# Patient Record
Sex: Female | Born: 1990 | Hispanic: No | Marital: Single | Smoking: Current every day smoker
Health system: Southern US, Community
[De-identification: ages and names within clinical notes are randomized; demographics above are authoritative.]

## PROBLEM LIST (undated history)

## (undated) DIAGNOSIS — F410 Panic disorder [episodic paroxysmal anxiety] without agoraphobia: Secondary | ICD-10-CM

## (undated) DIAGNOSIS — N83209 Unspecified ovarian cyst, unspecified side: Secondary | ICD-10-CM

## (undated) DIAGNOSIS — K2970 Gastritis, unspecified, without bleeding: Secondary | ICD-10-CM

## (undated) DIAGNOSIS — F419 Anxiety disorder, unspecified: Secondary | ICD-10-CM

## (undated) DIAGNOSIS — B009 Herpesviral infection, unspecified: Secondary | ICD-10-CM

## (undated) DIAGNOSIS — F329 Major depressive disorder, single episode, unspecified: Secondary | ICD-10-CM

## (undated) DIAGNOSIS — K219 Gastro-esophageal reflux disease without esophagitis: Secondary | ICD-10-CM

## (undated) DIAGNOSIS — F32A Depression, unspecified: Secondary | ICD-10-CM

## (undated) HISTORY — PX: NO PAST SURGERIES: SHX2092

---
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
## 2011-06-12 ENCOUNTER — Emergency Department (HOSPITAL_BASED_OUTPATIENT_CLINIC_OR_DEPARTMENT_OTHER)
Admission: EM | Admit: 2011-06-12 | Discharge: 2011-06-13 | Disposition: A | Discharge status: Home / Self Care | Payer: Commercial Managed Care - PPO | Attending: Emergency Medicine | Admitting: Emergency Medicine

## 2011-06-12 ENCOUNTER — Encounter: Payer: Self-pay | Admitting: *Deleted

## 2011-06-12 DIAGNOSIS — M549 Dorsalgia, unspecified: Secondary | ICD-10-CM

## 2011-06-12 DIAGNOSIS — M545 Low back pain, unspecified: Secondary | ICD-10-CM | POA: Insufficient documentation

## 2011-06-12 NOTE — ED Notes (Signed)
Pt removed from backboard by Dr Manus Gunning.

## 2011-06-12 NOTE — ED Notes (Signed)
C/o low back pain after sitting up on a bed and filling something pop. Pt was able to move from stool to stretcher when EMS arrived to get pt.

## 2011-06-13 MED ORDER — CYCLOBENZAPRINE HCL 10 MG PO TABS: 10.0000 mg | ORAL_TABLET | Freq: Two times a day (BID) | ORAL | Status: AC | PRN

## 2011-06-13 MED ORDER — IBUPROFEN 800 MG PO TABS: 800.0000 mg | ORAL_TABLET | Freq: Three times a day (TID) | ORAL | Status: AC

## 2011-06-13 MED ORDER — HYDROMORPHONE HCL 1 MG/ML IJ SOLN
1.0000 mg | Freq: Once | INTRAMUSCULAR | Status: AC
  Administered 2011-06-13: 1 mg via INTRAMUSCULAR
  Filled 2011-06-13: qty 1

## 2011-06-13 MED ORDER — HYDROCODONE-ACETAMINOPHEN 7.5-500 MG/15ML PO SOLN: 15.0000 mL | Freq: Four times a day (QID) | ORAL | Status: AC | PRN

## 2011-06-13 NOTE — ED Provider Notes (Signed)
History     CSN: [id number] Arrival date & time: 06/12/2011 11:30 PM  Chief Complaint  Patient presents with  . Back Pain   Patient is a 20 y.o. female presenting with back pain. The history is provided by the patient.  Back Pain  This is a new problem. The current episode started 3 to 5 hours ago. The problem occurs constantly. The problem has not changed since onset.Associated with: in iher CNA class demonsrating moving techniques, she was laying down, sat up and had sudden  onset L LBP. The pain is present in the lumbar spine. The quality of the pain is described as shooting. The pain radiates to the left thigh. The pain is moderate. The symptoms are aggravated by twisting and bending. Pertinent negatives include no chest pain, no fever, no numbness, no weight loss, no headaches, no abdominal swelling, no bowel incontinence, no perianal numbness, no bladder incontinence, no dysuria, no pelvic pain, no leg pain, no paresthesias, no tingling and no weakness. She has tried nothing for the symptoms. Risk factors: ha shurt her back in the past but no h/o DM or cancer.    History reviewed. No pertinent past medical history.  History reviewed. No pertinent past surgical history.  History reviewed. No pertinent family history.  History  Substance Use Topics  . Smoking status: Not on file  . Smokeless tobacco: Not on file  . Alcohol Use: Not on file    OB History    Grav Para Term Preterm Abortions TAB SAB Ect Mult Living                  Review of Systems  Constitutional: Negative for fever and weight loss.  Respiratory: Negative for shortness of breath.   Cardiovascular: Negative for chest pain.  Gastrointestinal: Negative for bowel incontinence.  Genitourinary: Negative for bladder incontinence, dysuria and pelvic pain.  Musculoskeletal: Positive for back pain. Negative for gait problem.  Neurological: Negative for tingling, weakness, numbness, headaches and paresthesias.     Physical Exam  BP 90/58  Pulse 77  Temp(Src) 98.7 F (37.1 C) (Oral)  Resp 20  SpO2 99%  Physical Exam  Constitutional: She is oriented to person, place, and time. She appears well-developed and well-nourished.  HENT:  Head: Normocephalic and atraumatic.  Eyes: Conjunctivae and EOM are normal. Pupils are equal, round, and reactive to light.  Neck: Full passive range of motion without pain. Neck supple. No thyromegaly present.       No meningismus  Cardiovascular: Normal rate, regular rhythm, S1 normal, S2 normal and intact distal pulses.   Pulmonary/Chest: Effort normal and breath sounds normal.  Abdominal: Soft. Bowel sounds are normal. There is no tenderness. There is no CVA tenderness.  Musculoskeletal: Normal range of motion.       Left paralumbar TTP lower lumbar area, no midline tenderness or deformity. No LE deficits with strengths and sensorium and DTRs at knees/ ankle equal and symeteric.  Neurological: She is alert and oriented to person, place, and time. She has normal strength and normal reflexes. No cranial nerve deficit or sensory deficit. She displays a negative Romberg sign. GCS eye subscore is 4. GCS verbal subscore is 5. GCS motor subscore is 6.       Normal Gait  Skin: Skin is warm and dry. No rash noted. No cyanosis. Nails show no clubbing.  Psychiatric: She has a normal mood and affect. Her speech is normal and behavior is normal.    ED Course  Procedures  MDM  LBP with mechanism that suggest strain, no deficits on exam or red flags to suggest need for emergent MRI.  Pain control and PCP referral with strict return precautions verbalized as understood.        Sunnie Nielsen, MD 06/13/11 [phone]

## 2011-06-17 ENCOUNTER — Emergency Department (HOSPITAL_BASED_OUTPATIENT_CLINIC_OR_DEPARTMENT_OTHER)
Admission: EM | Admit: 2011-06-17 | Discharge: 2011-06-17 | Disposition: A | Discharge status: Home / Self Care | Payer: Commercial Managed Care - PPO | Attending: Emergency Medicine | Admitting: Emergency Medicine

## 2011-06-17 ENCOUNTER — Encounter (HOSPITAL_BASED_OUTPATIENT_CLINIC_OR_DEPARTMENT_OTHER): Payer: Self-pay

## 2011-06-17 DIAGNOSIS — K219 Gastro-esophageal reflux disease without esophagitis: Secondary | ICD-10-CM | POA: Insufficient documentation

## 2011-06-17 DIAGNOSIS — M549 Dorsalgia, unspecified: Secondary | ICD-10-CM | POA: Insufficient documentation

## 2011-06-17 HISTORY — DX: Gastro-esophageal reflux disease without esophagitis: K21.9

## 2011-06-17 HISTORY — DX: Gastritis, unspecified, without bleeding: K29.70

## 2011-06-17 NOTE — ED Notes (Signed)
Back injury last week-needs return to school clinic

## 2011-06-17 NOTE — ED Provider Notes (Signed)
History     CSN: [id number] Arrival date & time: 06/17/2011  4:12 PM  Chief Complaint  Patient presents with  . Back Pain   Patient is a 20 y.o. female presenting with back pain. The history is provided by the patient.  Back Pain  This is a new problem. The current episode started more than 2 days ago. The problem occurs constantly. The problem has been resolved. The pain is associated with no known injury. The pain is present in the lumbar spine. The quality of the pain is described as stabbing. The pain does not radiate. The pain is at a severity of 7/10. The pain is moderate. The symptoms are aggravated by twisting and bending. The pain is worse during the day. Pertinent negatives include no numbness. She has tried nothing for the symptoms. The treatment provided moderate relief.  Pt needs to be rechecked to return to work.  Pt reports she feels better.  Past Medical History  Diagnosis Date  . GERD (gastroesophageal reflux disease)   . Gastritis     History reviewed. No pertinent past surgical history.  No family history on file.  History  Substance Use Topics  . Smoking status: Never Smoker   . Smokeless tobacco: Not on file  . Alcohol Use: No    OB History    Grav Para Term Preterm Abortions TAB SAB Ect Mult Living                  Review of Systems  Musculoskeletal: Positive for back pain.  Neurological: Negative for numbness.  All other systems reviewed and are negative.    Physical Exam  BP 127/74  Pulse 91  Temp(Src) 98.7 F (37.1 C) (Oral)  Resp 16  Ht 5\' 4"  (1.626 m)  Wt 184 lb (83.462 kg)  BMI 31.58 kg/m2  SpO2 100%  Physical Exam  Nursing note and vitals reviewed. Constitutional: She is oriented to person, place, and time. She appears well-developed and well-nourished.  HENT:  Head: Normocephalic and atraumatic.  Eyes: Pupils are equal, round, and reactive to light.  Neck: Normal range of motion. Neck supple.  Cardiovascular: Normal rate.     Pulmonary/Chest: Effort normal.  Abdominal: Soft.  Musculoskeletal: Normal range of motion.  Neurological: She is alert and oriented to person, place, and time. She has normal reflexes.  Skin: Skin is warm and dry.  Psychiatric: She has a normal mood and affect.    ED Course  Procedures  MDM  Pt advised to return if any problems.      Langston Masker, [place] 06/17/11 1718

## 2011-06-19 NOTE — ED Provider Notes (Signed)
History/physical exam/procedure(s) were performed by non-physician practitioner and as supervising physician I was immediately available for consultation/collaboration. I have reviewed all notes and am in agreement with care and plan.   Hilario Quarry, MD 06/19/11 [phone]

## 2011-09-12 ENCOUNTER — Emergency Department (HOSPITAL_BASED_OUTPATIENT_CLINIC_OR_DEPARTMENT_OTHER)
Admission: EM | Admit: 2011-09-12 | Discharge: 2011-09-12 | Disposition: A | Discharge status: Home / Self Care | Payer: Commercial Managed Care - PPO | Attending: Emergency Medicine | Admitting: Emergency Medicine

## 2011-09-12 ENCOUNTER — Encounter (HOSPITAL_BASED_OUTPATIENT_CLINIC_OR_DEPARTMENT_OTHER): Payer: Self-pay | Admitting: *Deleted

## 2011-09-12 DIAGNOSIS — J329 Chronic sinusitis, unspecified: Secondary | ICD-10-CM | POA: Insufficient documentation

## 2011-09-12 DIAGNOSIS — K219 Gastro-esophageal reflux disease without esophagitis: Secondary | ICD-10-CM | POA: Insufficient documentation

## 2011-09-12 DIAGNOSIS — Z79899 Other long term (current) drug therapy: Secondary | ICD-10-CM | POA: Insufficient documentation

## 2011-09-12 HISTORY — DX: Major depressive disorder, single episode, unspecified: F32.9

## 2011-09-12 HISTORY — DX: Depression, unspecified: F32.A

## 2011-09-12 MED ORDER — AMOXICILLIN 500 MG PO CAPS: 500.0000 mg | ORAL_CAPSULE | Freq: Three times a day (TID) | ORAL | Status: AC

## 2011-09-12 MED ORDER — PSEUDOEPHEDRINE HCL 60 MG PO TABS: 60.0000 mg | ORAL_TABLET | ORAL | Status: AC | PRN

## 2011-09-12 MED ORDER — IBUPROFEN 800 MG PO TABS: 800.0000 mg | ORAL_TABLET | Freq: Three times a day (TID) | ORAL | Status: AC

## 2011-09-12 NOTE — ED Provider Notes (Signed)
History     CSN: [id number] Arrival date & time: 09/12/2011  3:42 PM   First MD Initiated Contact with Patient 09/12/11 1544      Chief Complaint  Patient presents with  . Eye Pain    (Consider location/radiation/quality/duration/timing/severity/associated sxs/prior treatment) HPI Patient presents with complaint of headache localized primarily behind her left eye. She describes approximately 2 weeks of nasal congestion and then pressure and pain behind her left eye beginning yesterday. This morning the headache was frontal and bilateral but then again later today if localized behind left eye. She has had some yellow drainage from her nose. She has not had fever. She has not had any changes in her vision or double vision. She's had no vomiting or ear pain. She tried ibuprofen once yesterday and today. Also took cold medicine yesterday which provided minimal relief. She has no other systemic symptoms no cough. She has not had any significant sick contacts. She does have a history of sinus infections. She states that her left upper teeth are hurting when she walks or Them.  Past Medical History  Diagnosis Date  . GERD (gastroesophageal reflux disease)   . Gastritis   . Depression     History reviewed. No pertinent past surgical history.  No family history on file.  History  Substance Use Topics  . Smoking status: Never Smoker   . Smokeless tobacco: Not on file  . Alcohol Use: No    OB History    Grav Para Term Preterm Abortions TAB SAB Ect Mult Living                  Review of Systems ROS reviewed and otherwise negative except for mentioned in HPI  Allergies  Review of patient's allergies indicates no known allergies.  Home Medications   Current Outpatient Rx  Name Route Sig Dispense Refill  . BUPROPION HCL 100 MG PO TABS Oral Take 100 mg by mouth daily.      Marland Kitchen DM-PHENYLEPHRINE-ACETAMINOPHEN 10-5-325 MG PO CAPS Oral Take 2 capsules by mouth once as needed. For sinus  pressure and headache     . IBUPROFEN 200 MG PO TABS Oral Take 400 mg by mouth every 6 (six) hours as needed. For pain     . AMOXICILLIN 500 MG PO CAPS Oral Take 1 capsule (500 mg total) by mouth 3 (three) times daily. 42 capsule 0  . IBUPROFEN 800 MG PO TABS Oral Take 1 tablet (800 mg total) by mouth 3 (three) times daily. 21 tablet 0  . MEDROXYPROGESTERONE ACETATE 150 MG/ML IM SUSP Intramuscular Inject 150 mg into the muscle every 3 (three) months.      Marland Kitchen PSEUDOEPHEDRINE HCL 60 MG PO TABS Oral Take 1 tablet (60 mg total) by mouth every 4 (four) hours as needed for congestion. 30 tablet 0    BP 116/94  Pulse 90  Temp(Src) 98.1 F (36.7 C) (Oral)  Resp 24  SpO2 100% Vitals reviewed Physical Exam Physical Examination: General appearance - alert, well appearing, and in no distress Mental status - alert, oriented to person, place, and time Eyes - pupils equal and reactive, extraocular eye movements intact Nose - normal and patent, no erythema, discharge or polyps, mild erythema of left nasal turbinates Face- ttp over left maxillary and left frontal sinuses Mouth - mucous membranes moist, pharynx normal without lesions Neck - supple, no significant adenopathy Chest - clear to auscultation, no wheezes, rales or rhonchi, symmetric air entry Heart - normal rate, regular  rhythm, normal S1, S2, no murmurs, rubs, clicks or gallops Abdomen - soft, nontender, nondistended, no masses or organomegaly Neurological - alert, oriented, normal speech, no focal findings or movement disorder noted Musculoskeletal - no joint tenderness, deformity or swelling Extremities - peripheral pulses normal, no pedal edema, no clubbing or cyanosis Skin - normal coloration and turgor, no rashes, no suspicious skin lesions noted  ED Course  Procedures (including critical care time)  Labs Reviewed - No data to display No results found.   1. Sinusitis       MDM  Patient presenting with pain behind left eye  with nasal congestion and drainage over the past 2 weeks with pain beginning over the last 2 days. She does have tenderness to palpation over her maxillary and frontal sinuses on the left as well as tooth pain. We'll start on antibiotics for bacterial sinusitis as well as recommended decongestant and ibuprofen 3 times daily for discomfort. Patient was discharged with strict return precautions and is agreeable with this plan.        Ethelda Chick, MD 09/12/11 [phone]

## 2011-09-12 NOTE — ED Notes (Signed)
Pain behind her left eye since waking up yesterday.  Denies injury.

## 2011-09-12 NOTE — ED Notes (Signed)
Visual acuity being done at present time.

## 2013-03-22 ENCOUNTER — Encounter: Payer: Self-pay | Admitting: *Deleted

## 2013-03-22 ENCOUNTER — Emergency Department (INDEPENDENT_AMBULATORY_CARE_PROVIDER_SITE_OTHER)
Admission: EM | Admit: 2013-03-22 | Discharge: 2013-03-22 | Disposition: A | Discharge status: Home / Self Care | Payer: Commercial Managed Care - PPO | Source: Home / Self Care | Attending: Family Medicine | Admitting: Family Medicine

## 2013-03-22 DIAGNOSIS — M5432 Sciatica, left side: Secondary | ICD-10-CM

## 2013-03-22 DIAGNOSIS — M5430 Sciatica, unspecified side: Secondary | ICD-10-CM

## 2013-03-22 NOTE — ED Notes (Signed)
Jenna Velez reports flare of left sided sciatica 1 week ago. She was seen in the ER @ [facility] and treated. She reports improvement and feels she can return to work.

## 2013-03-22 NOTE — ED Provider Notes (Signed)
History     CSN: [id number]  Arrival date & time 03/22/13  1609   First MD Initiated Contact with Patient 03/22/13 1610      Chief Complaint  Patient presents with  . Sciatica   HPI  Pt presents today with chief complaint of back pain/sciatica follow up  Was seen in ER for sciatica 1 week ago.  Was put on steroid and muscle relaxers.  Wanted to to go back to work as pain resolved.  Job states that she needed a work note that stated she could work with restrictions.  No radicular sxs  Back with full ROM including flexion and extension.   Past Medical History  Diagnosis Date  . GERD (gastroesophageal reflux disease)   . Gastritis   . Depression     History reviewed. No pertinent past surgical history.  History reviewed. No pertinent family history.  History  Substance Use Topics  . Smoking status: Never Smoker   . Smokeless tobacco: Never Used  . Alcohol Use: No    OB History   Grav Para Term Preterm Abortions TAB SAB Ect Mult Living                  Review of Systems  All other systems reviewed and are negative.    Allergies  Review of patient's allergies indicates no known allergies.  Home Medications   Current Outpatient Rx  Name  Route  Sig  Dispense  Refill  . buPROPion (WELLBUTRIN) 100 MG tablet   Oral   Take 100 mg by mouth daily.           Marland Kitchen ibuprofen (ADVIL,MOTRIN) 200 MG tablet   Oral   Take 400 mg by mouth every 6 (six) hours as needed. For pain          . medroxyPROGESTERone (DEPO-PROVERA) 150 MG/ML injection   Intramuscular   Inject 150 mg into the muscle every 3 (three) months.             BP 120/78  Pulse 100  Resp 16  Ht 5\' 5"  (1.651 m)  Wt 183 lb (83.008 kg)  BMI 30.45 kg/m2  SpO2 99%  Physical Exam  Constitutional: She appears well-developed and well-nourished.  HENT:  Head: Normocephalic and atraumatic.  Eyes: Pupils are equal, round, and reactive to light.  Neck: Normal range of motion.  Cardiovascular:  Normal rate and regular rhythm.   Pulmonary/Chest: Effort normal and breath sounds normal.  Abdominal: Soft.  Musculoskeletal: Normal range of motion.  No lumbar TTP  Full ROM  Neurovascularly intact distally    Neurological: She is alert.  Skin: Skin is warm.    ED Course  Procedures (including critical care time)  Labs Reviewed - No data to display No results found.   1. Sciatica, left       MDM  Clinically resolved.  Work note given  Follow up as needed.     The patient and/or caregiver has been counseled thoroughly with regard to treatment plan and/or medications prescribed including dosage, schedule, interactions, rationale for use, and possible side effects and they verbalize understanding. Diagnoses and expected course of recovery discussed and will return if not improved as expected or if the condition worsens. Patient and/or caregiver verbalized understanding.             Doree Albee, MD 03/22/13 [phone]

## 2013-04-12 ENCOUNTER — Emergency Department (INDEPENDENT_AMBULATORY_CARE_PROVIDER_SITE_OTHER)
Admission: EM | Admit: 2013-04-12 | Discharge: 2013-04-12 | Disposition: A | Discharge status: Home / Self Care | Payer: Commercial Managed Care - PPO | Source: Home / Self Care | Attending: Family Medicine | Admitting: Family Medicine

## 2013-04-12 ENCOUNTER — Encounter: Payer: Self-pay | Admitting: *Deleted

## 2013-04-12 DIAGNOSIS — A088 Other specified intestinal infections: Secondary | ICD-10-CM

## 2013-04-12 DIAGNOSIS — A084 Viral intestinal infection, unspecified: Secondary | ICD-10-CM

## 2013-04-12 MED ORDER — ONDANSETRON 4 MG PO TBDP: 4.0000 mg | ORAL_TABLET | Freq: Three times a day (TID) | ORAL | Status: DC | PRN

## 2013-04-12 NOTE — ED Provider Notes (Signed)
   History    CSN: [id number] Arrival date & time 04/12/13  1636  First MD Initiated Contact with Patient 04/12/13 1638     Chief Complaint  Patient presents with  . Emesis  . Nausea    HPI  Diarrhea: Onset: 2 days  Description: nausea, vomiting, diarrhea x 2 days  Modifying factors: none    Symptoms: Incontinence: yes Vomiting: yes Abdominal Pain: no Urgency: yes Relief with defecation: midl Weight loss: no Decreased urine output: no Lightheadedness: no Recent travel history: no Sick contacts: yes Suspicious food or water: no Change in diet: no    Red Flags: Fever: mp Bloody stools: mp Recent antibiotics: mp Immuncompromised: mp   Past Medical History  Diagnosis Date  . GERD (gastroesophageal reflux disease)   . Gastritis   . Depression    History reviewed. No pertinent past surgical history. History reviewed. No pertinent family history. History  Substance Use Topics  . Smoking status: Current Every Day Smoker  . Smokeless tobacco: Never Used  . Alcohol Use: Yes   OB History   Grav Para Term Preterm Abortions TAB SAB Ect Mult Living                 Review of Systems  All other systems reviewed and are negative.    Allergies  Review of patient's allergies indicates no known allergies.  Home Medications   Current Outpatient Rx  Name  Route  Sig  Dispense  Refill  . buPROPion (WELLBUTRIN) 100 MG tablet   Oral   Take 100 mg by mouth daily.           Marland Kitchen ibuprofen (ADVIL,MOTRIN) 200 MG tablet   Oral   Take 400 mg by mouth every 6 (six) hours as needed. For pain          . medroxyPROGESTERone (DEPO-PROVERA) 150 MG/ML injection   Intramuscular   Inject 150 mg into the muscle every 3 (three) months.            BP 110/74  Pulse 89  Temp(Src) 98.5 F (36.9 C) (Oral)  Resp 18  Wt 177 lb (80.287 kg)  BMI 29.45 kg/m2  SpO2 98% Physical Exam  Constitutional: She appears well-developed and well-nourished.  HENT:  Head:  Normocephalic and atraumatic.  Eyes: Conjunctivae are normal. Pupils are equal, round, and reactive to light.  Neck: Normal range of motion. Neck supple.  Cardiovascular: Normal rate, regular rhythm and normal heart sounds.   Pulmonary/Chest: Effort normal and breath sounds normal.  Abdominal: Soft. Bowel sounds are normal.  Minimal generalized abdominal tenderness   Musculoskeletal: Normal range of motion.  Neurological: She is alert.  Skin: Skin is warm.    ED Course  Procedures (including critical care time) Labs Reviewed - No data to display No results found. 1. Viral gastroenteritis     MDM  Likely viral process. Discussed supportive care and GI/infectious red flags. Zofran for when necessary nausea. Followup as needed.     The patient and/or caregiver has been counseled thoroughly with regard to treatment plan and/or medications prescribed including dosage, schedule, interactions, rationale for use, and possible side effects and they verbalize understanding. Diagnoses and expected course of recovery discussed and will return if not improved as expected or if the condition worsens. Patient and/or caregiver verbalized understanding.       Doree Albee, MD 04/12/13 1730

## 2013-04-12 NOTE — ED Notes (Signed)
Pt c/o nausea, vomiting, and diarrhea x 1 day. Denies fever. She reports mostly diarrhea today.

## 2013-04-15 ENCOUNTER — Encounter: Payer: Self-pay | Admitting: *Deleted

## 2013-04-15 ENCOUNTER — Emergency Department (INDEPENDENT_AMBULATORY_CARE_PROVIDER_SITE_OTHER)
Admission: EM | Admit: 2013-04-15 | Discharge: 2013-04-15 | Disposition: A | Discharge status: Home / Self Care | Payer: Commercial Managed Care - PPO | Source: Home / Self Care

## 2013-04-15 DIAGNOSIS — J020 Streptococcal pharyngitis: Secondary | ICD-10-CM

## 2013-04-15 LAB — POCT RAPID STREP A (OFFICE): Rapid Strep A Screen: POSITIVE — AB

## 2013-04-15 MED ORDER — PREDNISOLONE 15 MG/5ML PO SOLN: ORAL | Status: DC

## 2013-04-15 MED ORDER — PENICILLIN G BENZATHINE 1200000 UNIT/2ML IM SUSP
1.2000 10*6.[IU] | Freq: Once | INTRAMUSCULAR | Status: AC
  Administered 2013-04-15: 1.2 10*6.[IU] via INTRAMUSCULAR

## 2013-04-15 NOTE — ED Provider Notes (Signed)
History    CSN: [id number] Arrival date & time 04/15/13  1440  None    Chief Complaint  Patient presents with  . Sore Throat  . Nausea  . Fever      HPI Comments: Patient awoke with a severe sore throat today.  Her mom states that she has had several strep infections per year over the past several years.  The history is provided by the patient and a parent.   Past Medical History  Diagnosis Date  . GERD (gastroesophageal reflux disease)   . Gastritis   . Depression    History reviewed. No pertinent past surgical history. History reviewed. No pertinent family history. History  Substance Use Topics  . Smoking status: Current Every Day Smoker  . Smokeless tobacco: Never Used  . Alcohol Use: Yes   OB History   Grav Para Term Preterm Abortions TAB SAB Ect Mult Living                 Review of Systems + sore throat No cough No pleuritic pain No wheezing No nasal congestion No post-nasal drainage No sinus pain/pressure No itchy/red eyes No earache No hemoptysis No SOB + fever, + chills + nausea No vomiting + abdominal pain No diarrhea No urinary symptoms No skin rashes + fatigue + myalgias + headache    Allergies  Review of patient's allergies indicates no known allergies.  Home Medications   Current Outpatient Rx  Name  Route  Sig  Dispense  Refill  . buPROPion (WELLBUTRIN) 100 MG tablet   Oral   Take 100 mg by mouth daily.           Marland Kitchen ibuprofen (ADVIL,MOTRIN) 200 MG tablet   Oral   Take 400 mg by mouth every 6 (six) hours as needed. For pain          . medroxyPROGESTERone (DEPO-PROVERA) 150 MG/ML injection   Intramuscular   Inject 150 mg into the muscle every 3 (three) months.           . ondansetron (ZOFRAN ODT) 4 MG disintegrating tablet   Oral   Take 1 tablet (4 mg total) by mouth every 8 (eight) hours as needed for nausea.   20 tablet   0   . prednisoLONE (PRELONE) 15 MG/5ML SOLN      Take 5mL by mouth twice daily for 5  days.  Take with food.   50 mL   0    BP 110/76  Pulse 97  Temp(Src) 100.2 F (37.9 C) (Oral)  Resp 18  Wt 176 lb (79.833 kg)  BMI 29.29 kg/m2  SpO2 100% Physical Exam Nursing notes and Vital Signs reviewed. Appearance:  Patient appears healthy, stated age, and in no acute distress Eyes:  Pupils are equal, round, and reactive to light and accomodation.  Extraocular movement is intact.  Conjunctivae are not inflamed  Ears:  Canals normal.  Tympanic membranes normal.  Nose:  Normal turbinates.  No sinus tenderness.   Pharynx:  Erythematous and swollen without obstruction.  Minimal exudate Neck:  Supple.   Tender enlarged anterior nodes are palpated bilaterally  Lungs:  Clear to auscultation.  Breath sounds are equal.  Heart:  Regular rate and rhythm without murmurs, rubs, or gallops.  Abdomen:  Nontender without masses or hepatosplenomegaly.  Bowel sounds are present.  No CVA or flank tenderness.  Extremities:  No edema.  No calf tenderness Skin:  No rash present.   ED Course  Procedures  none  Labs Reviewed  POCT RAPID STREP A (OFFICE) - Abnormal; Notable for the following:    Rapid Strep A Screen Positive (*)         1. Streptococcal sore throat     MDM  Bicillin LA 1.2 million units IM Begin prednisolone susp 15mg  po BID for 5 days Rest, increase fluid intake.  May take Tylenol for pain.  Try warm salt water gargles. Recommend ENT physician evaluation for possible tonsillectomy. Followup with PCP in one week; recommend throat culture to ensure eradication  Note:  Patient refused to stay the recommended amount of time for observation after her penicillin injection.  Lattie Haw, MD 04/15/13 [phone]

## 2013-04-15 NOTE — ED Notes (Signed)
Pt c/o sore throat, fever, body aches, and nausea x today. She would also like an ENT referral due to having strep multiple times in the past .

## 2013-04-15 NOTE — ED Notes (Addendum)
Pt refused to wait the appropriate amount of time after receiving an ABT injection. Discussed adverse reaction. Waiver signed. Roselyne Stalnaker,LPN

## 2013-07-16 ENCOUNTER — Emergency Department (INDEPENDENT_AMBULATORY_CARE_PROVIDER_SITE_OTHER)
Admission: EM | Admit: 2013-07-16 | Discharge: 2013-07-16 | Disposition: A | Discharge status: Home / Self Care | Payer: Commercial Managed Care - PPO | Source: Home / Self Care | Attending: Emergency Medicine | Admitting: Emergency Medicine

## 2013-07-16 DIAGNOSIS — R112 Nausea with vomiting, unspecified: Secondary | ICD-10-CM

## 2013-07-16 DIAGNOSIS — R1011 Right upper quadrant pain: Secondary | ICD-10-CM

## 2013-07-16 LAB — POCT CBC W AUTO DIFF (K'VILLE URGENT CARE)

## 2013-07-16 MED ORDER — ESOMEPRAZOLE MAGNESIUM 40 MG PO CPDR: 40.0000 mg | DELAYED_RELEASE_CAPSULE | Freq: Every day | ORAL | Status: DC

## 2013-07-16 MED ORDER — ONDANSETRON 4 MG PO TBDP: 4.0000 mg | ORAL_TABLET | Freq: Three times a day (TID) | ORAL | Status: DC | PRN

## 2013-07-16 MED ORDER — KETOROLAC TROMETHAMINE 60 MG/2ML IM SOLN
60.0000 mg | Freq: Once | INTRAMUSCULAR | Status: AC
  Administered 2013-07-16: 60 mg via INTRAMUSCULAR

## 2013-07-16 MED ORDER — PROMETHAZINE HCL 25 MG/ML IJ SOLN
25.0000 mg | Freq: Once | INTRAMUSCULAR | Status: AC
  Administered 2013-07-16: 25 mg via INTRAMUSCULAR

## 2013-07-16 NOTE — ED Notes (Signed)
Jenna Velez reports [place] greasy food [place] the fair last [place]. She awoke @ 2am this AM with nausea and right upper abdominal and side pain  With nausea and vomiting. Denies fever. She reports she had gallbladder problems while pregnant.

## 2013-07-16 NOTE — ED Provider Notes (Signed)
CSN: [id number]     Arrival date & time 07/16/13  1443 History   First MD Initiated Contact with Patient 07/16/13 1449     Chief Complaint  Patient presents with  . Nausea   (Consider location/radiation/quality/duration/timing/severity/associated sxs/prior Treatment) Patient is a 22 y.o. female presenting with abdominal pain. The history is provided by the patient.  Abdominal Pain This is a new (Right upper quadrant pain, 4/10 in intensity, dull) problem. Episode onset: 14 hours ago after eating very greasy food at the fair last night. The problem occurs constantly. The problem has not changed since onset.Associated symptoms include abdominal pain (Right upper quadrant). Pertinent negatives include no chest pain, no headaches and no shortness of breath. Associated symptoms comments: The other predominant symptom is nausea, queasy feeling. Vomited x3 nonbloody watery vomitus. The symptoms are aggravated by eating. Nothing relieves the symptoms. She has tried nothing for the symptoms.   Pertinent past medical history: In 2011, had similar, but worse episodes of right upper quadrant pain.-Ultrasound and CT showed gallbladder sludge, but symptoms eventually resolved on improved diet.--Symptoms did not recur until this episode. Denies history of abdominal surgery. Past Medical History  Diagnosis Date  . GERD (gastroesophageal reflux disease)   . Gastritis   . Depression    No past surgical history on file. No family history on file. History  Substance Use Topics  . Smoking status: Current Every Day Smoker  . Smokeless tobacco: Never Used  . Alcohol Use: Yes   OB History   Grav Para Term Preterm Abortions TAB SAB Ect Mult Living                 Review of Systems  Constitutional: Positive for appetite change (Decreased). Negative for fever and chills.  HENT: Negative.   Eyes: Negative.   Respiratory: Negative.  Negative for shortness of breath.   Cardiovascular: Negative.  Negative  for chest pain.  Gastrointestinal: Positive for nausea, vomiting and abdominal pain (Right upper quadrant). Negative for diarrhea (But she had 2 soft stools without blood or mucus or melena.--- This is normal for her), blood in stool, abdominal distention and anal bleeding.  Genitourinary: Negative.  Negative for dysuria, frequency, vaginal bleeding and vaginal discharge.  Musculoskeletal: Negative for back pain and joint swelling.  Neurological: Negative for headaches.  Psychiatric/Behavioral: Negative.   All other systems reviewed and are negative.    Allergies  Review of patient's allergies indicates no known allergies.  Home Medications   Current Outpatient Rx  Name  Route  Sig  Dispense  Refill  . phentermine (ADIPEX-P) 37.5 MG tablet   Oral   Take 37.5 mg by mouth daily before breakfast.         . buPROPion (WELLBUTRIN) 100 MG tablet   Oral   Take 100 mg by mouth daily.           Marland Kitchen ibuprofen (ADVIL,MOTRIN) 200 MG tablet   Oral   Take 400 mg by mouth every 6 (six) hours as needed. For pain          . medroxyPROGESTERone (DEPO-PROVERA) 150 MG/ML injection   Intramuscular   Inject 150 mg into the muscle every 3 (three) months.            BP 109/75  Pulse 66  Temp(Src) 98.3 F (36.8 C) (Oral)  Resp 14  Wt 172 lb (78.019 kg)  BMI 28.62 kg/m2  SpO2 98% Physical Exam  Nursing note and vitals reviewed. Constitutional: She is oriented to person,  place, and time. She appears well-developed and well-nourished.  Non-toxic appearance. She appears ill. No distress.  Appears fatigued, mildly uncomfortable from right upper quadrant abdominal pain, but no acute distress. Pleasant, cooperative.  HENT:  Head: Normocephalic and atraumatic.  Nose: Nose normal.  Mouth/Throat: Oropharynx is clear and moist.  Oropharynx clear. Some moisture on mucous membranes. No lesions.  Eyes: Pupils are equal, round, and reactive to light. No scleral icterus.  Neck: Normal range of  motion. Neck supple. No JVD present.  Cardiovascular: Normal rate, regular rhythm and normal heart sounds.   No murmur heard. Pulmonary/Chest: Effort normal and breath sounds normal.  Abdominal: Soft. Bowel sounds are normal. She exhibits no distension, no abdominal bruit and no mass. There is no hepatosplenomegaly. There is tenderness (mild) in the right upper quadrant. There is no rigidity, no rebound, no guarding and no CVA tenderness.    Musculoskeletal: Normal range of motion. She exhibits no edema and no tenderness.  Lymphadenopathy:    She has no cervical adenopathy.  Neurological: She is alert and oriented to person, place, and time.  Skin: No rash noted.  Psychiatric: She has a normal mood and affect.    ED Course  Procedures (including critical care time) Labs Review Labs Reviewed  AMYLASE  LIPASE  COMPREHENSIVE METABOLIC PANEL  POCT CBC W AUTO DIFF ([facility])   Imaging Review No results found.  EKG Interpretation     Ventricular Rate:    PR Interval:    QRS Duration:   QT Interval:    QTC Calculation:   R Axis:     Text Interpretation:              MDM   1. Abdominal pain, right upper quadrant   2. Nausea with vomiting    CBC normal. WBC 7.8, 70% granulocytes. Hemoglobin 14.4. Platelets normal. Based on history and exam, symptoms of nausea and vomiting and mild right upper quadrant pain most likely are from gallbladder inflammation. Also in the differential: Pancreatitis, although less likely. Gastritis. Clinically, no rebound on exam and CBC is normal. No evidence of acute surgical abdomen. After risks, benefits, alternatives discussed, we discussed workup and treatment options at length. She declined any imaging tests at this time. Blood drawn to send to reference lab: Amylase, lipase, CMP. She will avoid all greasy or spicy foods, drink small amount of clear liquids and advance as tolerated. Here in urgent care: IM tx: Phenergan 25  mg IM and Toradol 60 mg IM . She called her father to come here to urgent care or to drive her home. Prescriptions for Zofran ODT and Nexium. Red flags discussed. Right now, it's Friday afternoon. Advised to Go to ER if any severe or worsening symptoms . She is new to the area, and I advised her to establish with PCP, names and phone numbers given. Precautions discussed. Red flags discussed. Questions invited and answered. Patient voiced understanding and agreement. Followup with PCP in one week, but go to ER sooner if needed.     Lajean Manes, MD 07/16/13 [phone]

## 2013-07-17 ENCOUNTER — Telehealth: Payer: Self-pay | Admitting: Emergency Medicine

## 2013-07-17 LAB — COMPREHENSIVE METABOLIC PANEL
ALT: 14 U/L (ref 0–35)
AST: 16 U/L (ref 0–37)
Albumin: 4.7 g/dL (ref 3.5–5.2)
Alkaline Phosphatase: 72 U/L (ref 39–117)
BUN: 7 mg/dL (ref 6–23)
CO2: 25 mEq/L (ref 19–32)
Calcium: 9.6 mg/dL (ref 8.4–10.5)
Chloride: 105 mEq/L (ref 96–112)
Creat: 0.94 mg/dL (ref 0.50–1.10)
Glucose, Bld: 80 mg/dL (ref 70–99)
Potassium: 4.2 mEq/L (ref 3.5–5.3)
Sodium: 138 mEq/L (ref 135–145)
Total Bilirubin: 0.7 mg/dL (ref 0.3–1.2)
Total Protein: 7.2 g/dL (ref 6.0–8.3)

## 2013-07-17 LAB — AMYLASE: Amylase: 32 U/L (ref 0–105)

## 2013-07-17 LAB — LIPASE: Lipase: 12 U/L (ref 0–75)

## 2013-09-10 ENCOUNTER — Emergency Department (INDEPENDENT_AMBULATORY_CARE_PROVIDER_SITE_OTHER)
Admission: EM | Admit: 2013-09-10 | Discharge: 2013-09-10 | Disposition: A | Discharge status: Home / Self Care | Payer: Commercial Managed Care - PPO | Source: Home / Self Care | Attending: Emergency Medicine | Admitting: Emergency Medicine

## 2013-09-10 ENCOUNTER — Encounter: Payer: Self-pay | Admitting: Emergency Medicine

## 2013-09-10 DIAGNOSIS — N912 Amenorrhea, unspecified: Secondary | ICD-10-CM

## 2013-09-10 DIAGNOSIS — R51 Headache: Secondary | ICD-10-CM

## 2013-09-10 DIAGNOSIS — G43001 Migraine without aura, not intractable, with status migrainosus: Secondary | ICD-10-CM

## 2013-09-10 DIAGNOSIS — N911 Secondary amenorrhea: Secondary | ICD-10-CM

## 2013-09-10 LAB — POCT URINE PREGNANCY: Preg Test, Ur: NEGATIVE

## 2013-09-10 MED ORDER — CYCLOBENZAPRINE HCL 10 MG PO TABS: ORAL_TABLET | ORAL | Status: DC

## 2013-09-10 MED ORDER — KETOROLAC TROMETHAMINE 60 MG/2ML IM SOLN
60.0000 mg | Freq: Once | INTRAMUSCULAR | Status: AC
  Administered 2013-09-10: 60 mg via INTRAMUSCULAR

## 2013-09-10 MED ORDER — CYCLOBENZAPRINE HCL 5 MG PO TABS: ORAL_TABLET | ORAL | Status: DC

## 2013-09-10 NOTE — ED Provider Notes (Signed)
CSN: [id number]     Arrival date & time 09/10/13  1546 History   First MD Initiated Contact with Patient 09/10/13 1636     Chief Complaint  Patient presents with  . Headache   (Consider location/radiation/quality/duration/timing/severity/associated sxs/prior Treatment) Patient is a 22 y.o. female presenting with migraines. The history is provided by the patient.  Migraine This is a chronic (First severe one was about one year ago, and eventually resolved over hours by lying in a dark room) problem. The current episode started 2 days ago. The problem occurs constantly. The problem has been gradually worsening. Pertinent negatives include no chest pain, no abdominal pain and no shortness of breath. Exacerbated by: Light/photophobia. Nothing relieves the symptoms. Treatments tried: Ibuprofen and Excedrin and her mother's by mouth Imitrex. The treatment provided mild relief.   She's not sure what triggered this. He admits to skipping meals occasionally. She smokes cigarettes daily.  Pain is pressure type pain bitemporal and by occipital and biparietal. And posterior cervical . Currently 7/10. This is not the worst headache of her life. Past Medical History  Diagnosis Date  . GERD (gastroesophageal reflux disease)   . Gastritis   . Depression    History reviewed. No pertinent past surgical history. History reviewed. No pertinent family history. History  Substance Use Topics  . Smoking status: Current Every Day Smoker  . Smokeless tobacco: Never Used  . Alcohol Use: Yes   OB History   Grav Para Term Preterm Abortions TAB SAB Ect Mult Living                 Review of Systems  Constitutional: Positive for fatigue. Negative for fever.  HENT: Negative.   Eyes: Positive for photophobia. Negative for visual disturbance.  Respiratory: Negative.  Negative for shortness of breath.   Cardiovascular: Negative.  Negative for chest pain.  Gastrointestinal: Negative for nausea, vomiting and  abdominal pain.  Genitourinary: Negative.   Neurological: Negative for seizures, syncope, facial asymmetry, speech difficulty and numbness.  Psychiatric/Behavioral: Negative.  Negative for confusion.   Last menstrual period was 2 months ago, but she feels is from the Mirena IUD. Allergies  Review of patient's allergies indicates no known allergies.  Home Medications   Current Outpatient Rx  Name  Route  Sig  Dispense  Refill  . levonorgestrel (MIRENA) 20 MCG/24HR IUD   Intrauterine   1 each by Intrauterine route once.         . cyclobenzaprine (FLEXERIL) 5 MG tablet      Take 1 or 2 every 8 hours as needed for muscle relaxant/headache. Caution: May cause drowsiness.   15 tablet   0   . phentermine (ADIPEX-P) 37.5 MG tablet   Oral   Take 37.5 mg by mouth daily before breakfast.          BP 123/81  Pulse 87  Temp(Src) 98.2 F (36.8 C) (Oral)  Resp 16  Wt 172 lb (78.019 kg)  SpO2 99% Physical Exam  Nursing note and vitals reviewed. Constitutional: She appears well-developed and well-nourished. She appears distressed (Uncomfortable from headache.).  HENT:  Head: Normocephalic and atraumatic. Head is without raccoon's eyes, without contusion, without right periorbital erythema and without left periorbital erythema.  Right Ear: External ear and ear canal normal. No drainage. Tympanic membrane is not erythematous.  Left Ear: External ear and ear canal normal. No drainage. Tympanic membrane is not erythematous.  Nose: Nose normal. Right sinus exhibits no maxillary sinus tenderness. Left sinus exhibits no  maxillary sinus tenderness.  Mouth/Throat: Oropharynx is clear and moist and mucous membranes are normal. No oral lesions. No dental abscesses. No oropharyngeal exudate, posterior oropharyngeal edema or posterior oropharyngeal erythema.      No cranial bruits.  Normal temporal arteries.  Eyes: Conjunctivae, EOM and lids are normal. Pupils are equal, round, and reactive to  light.  Fundoscopic exam:      The right eye shows no exudate, no hemorrhage and no papilledema.       The left eye shows no exudate, no hemorrhage and no papilledema.  Neck: Neck supple. Normal carotid pulses and no JVD present. Carotid bruit is not present. No tracheal deviation present. No thyromegaly present.  Cardiovascular: Regular rhythm and normal heart sounds.   Pulmonary/Chest: Effort normal and breath sounds normal. No respiratory distress. She has no wheezes. She has no rales.  Abdominal: Soft. There is no tenderness.  Musculoskeletal: She exhibits no edema and no tenderness.  Neurological: She is alert. She has normal strength and normal reflexes. No cranial nerve deficit or sensory deficit. Coordination normal.  Reflex Scores:      Tricep reflexes are 2+ on the right side and 2+ on the left side.      Bicep reflexes are 2+ on the right side and 2+ on the left side.      Brachioradialis reflexes are 2+ on the right side and 2+ on the left side.      Patellar reflexes are 2+ on the right side and 2+ on the left side.      Achilles reflexes are 2+ on the right side and 2+ on the left side. Skin: Skin is warm and dry. No rash noted.  Psychiatric: She has a normal mood and affect.   Addendum to head/Neck: Mild biparietal tenderness and bitemporal tenderness and posterior cervical muscle tenderness, minimal spasm. No torticollis. No rigidity. ED Course  Procedures (including critical care time) Labs Review Labs Reviewed  POCT URINE PREGNANCY   Imaging Review No results found.  EKG Interpretation    Date/Time:    Ventricular Rate:    PR Interval:    QRS Duration:   QT Interval:    QTC Calculation:   R Axis:     Text Interpretation:              MDM   1. Headache(784.0)   2. Migraine without aura and with status migrainosus, not intractable   3. Drug induced amenorrhea    She also has secondary muscle contraction headache. Because her last menstrual period  was 2 months ago, urine pregnancy test was done which was negative.  For the acute migraine/headache. Toradol 60 mg IM stat. She tolerated this well and headache improved somewhat. We discharged her and she is able to go home. Advised her to lie down in a dark room at home and rest. Advised to eat a small meal, as she hasn't eaten food for 12 hours today. Prescribed Flexeril 5 mg, 1 or 2 by mouth every 8 hours when necessary muscle relaxant, hopefully this will help the muscle contraction component of her headache. Also, continue Excedrin Migraine as needed. I advised followup with her PCP within 1 week to reevaluate, sooner if worse or new symptoms. I explained her PCP might need to refer to neurologist if her headaches persisted or recurred. Go to ER if any severe worsening of symptoms.--I  mentioned that she should reevaluate with her prescribing physician whether or not to continue phentermine, as that might  be predisposing her to headache.  Precautions discussed. Red flags discussed. Questions invited and answered. Patient voiced understanding and agreement.  Lajean Manes, MD 09/10/13 [phone]

## 2013-09-10 NOTE — ED Notes (Signed)
Jenna Velez c/o gradual onset HA with light sensitivity x 2 days. She has taken IBF, Excedrin and a dose of Imitrex (not together) without full relief.

## 2014-01-24 ENCOUNTER — Encounter: Payer: Self-pay | Admitting: Emergency Medicine

## 2014-01-24 ENCOUNTER — Emergency Department (INDEPENDENT_AMBULATORY_CARE_PROVIDER_SITE_OTHER)
Admission: EM | Admit: 2014-01-24 | Discharge: 2014-01-24 | Disposition: A | Discharge status: Home / Self Care | Payer: 59 | Source: Home / Self Care | Attending: Family Medicine | Admitting: Family Medicine

## 2014-01-24 DIAGNOSIS — N898 Other specified noninflammatory disorders of vagina: Secondary | ICD-10-CM

## 2014-01-24 DIAGNOSIS — N949 Unspecified condition associated with female genital organs and menstrual cycle: Secondary | ICD-10-CM

## 2014-01-24 DIAGNOSIS — R102 Pelvic and perineal pain: Secondary | ICD-10-CM

## 2014-01-24 MED ORDER — FLUCONAZOLE 150 MG PO TABS: ORAL_TABLET | ORAL | Status: DC

## 2014-01-24 MED ORDER — DOXYCYCLINE HYCLATE 100 MG PO CAPS: 100.0000 mg | ORAL_CAPSULE | Freq: Two times a day (BID) | ORAL | Status: DC

## 2014-01-24 MED ORDER — VALACYCLOVIR HCL 1 G PO TABS: 1000.0000 mg | ORAL_TABLET | Freq: Two times a day (BID) | ORAL | Status: DC

## 2014-01-24 MED ORDER — HYDROCODONE-ACETAMINOPHEN 5-325 MG PO TABS: 1.0000 | ORAL_TABLET | Freq: Four times a day (QID) | ORAL | Status: DC | PRN

## 2014-01-24 MED ORDER — CEFTRIAXONE SODIUM 250 MG IJ SOLR
250.0000 mg | Freq: Once | INTRAMUSCULAR | Status: AC
  Administered 2014-01-24: 250 mg via INTRAMUSCULAR

## 2014-01-24 NOTE — Discharge Instructions (Signed)
May take Ibuprofen 200mg , 4 tabs every 8 hours with food.  Try cold compresses for swelling.  For further information go to the website of the [employer].

## 2014-01-24 NOTE — ED Notes (Signed)
Jonai c/o thick white discharge, vaginal pain and itching and bumps on her labia x 2-3 days. She also reports a break in her the skin @ the top of her labia which causes increased pain.

## 2014-01-24 NOTE — ED Provider Notes (Signed)
CSN: [id number]     Arrival date & time 01/24/14  1028 History   First MD Initiated Contact with Patient 01/24/14 1050     Chief Complaint  Patient presents with  . Vaginal Discharge  . Vaginal Itching      HPI Comments: Patient complains of two day history of swelling, pain, and rash on her labia.  She has had a vaginal discharge with burning and itching externally.  No abdominal or pelvic pain.  No urinary symptoms.  She states that her boyfriend has a rash on his penis.  Patient's last menstrual period was 01/05/2014 (she has the Mirena IUD)  Patient is a 23 y.o. female presenting with vaginal discharge. The history is provided by the patient.  Vaginal Discharge Quality:  White and thick Severity:  Moderate Onset quality:  Sudden Duration:  2 days Timing:  Constant Progression:  Worsening Chronicity:  New Context: after intercourse   Relieved by:  Nothing Worsened by:  Intercourse and urination Ineffective treatments:  None tried Associated symptoms: dyspareunia, genital lesions and vaginal itching   Associated symptoms: no abdominal pain, no dysuria, no fever, no nausea, no rash, no urinary frequency, no urinary hesitancy, no urinary incontinence and no vomiting   Risk factors: STI exposure     Past Medical History  Diagnosis Date  . GERD (gastroesophageal reflux disease)   . Gastritis   . Depression    History reviewed. No pertinent past surgical history. History reviewed. No pertinent family history. History  Substance Use Topics  . Smoking status: Current Every Day Smoker  . Smokeless tobacco: Never Used  . Alcohol Use: Yes   OB History   Grav Para Term Preterm Abortions TAB SAB Ect Mult Living                 Review of Systems  Constitutional: Negative for fever.  Gastrointestinal: Negative for nausea, vomiting and abdominal pain.  Genitourinary: Positive for vaginal discharge and dyspareunia. Negative for bladder incontinence, dysuria and hesitancy.  All  other systems reviewed and are negative.   Allergies  Review of patient's allergies indicates no known allergies.  Home Medications   Prior to Admission medications   Medication Sig Start Date End Date Taking? Authorizing Provider  doxycycline (VIBRAMYCIN) 100 MG capsule Take 1 capsule (100 mg total) by mouth 2 (two) times daily. 01/24/14   Lattie HawStephen A Beese, MD  fluconazole (DIFLUCAN) 150 MG tablet Take one tab by mouth as a single dose.  May repeat in 3 days 01/24/14   Lattie HawStephen A Beese, MD  HYDROcodone-acetaminophen (NORCO/VICODIN) 5-325 MG per tablet Take 1 tablet by mouth every 6 (six) hours as needed for moderate pain. 01/24/14   Lattie HawStephen A Beese, MD  levonorgestrel (MIRENA) 20 MCG/24HR IUD 1 each by Intrauterine route once.    Historical Provider, MD  phentermine (ADIPEX-P) 37.5 MG tablet Take 37.5 mg by mouth daily before breakfast.    Historical Provider, MD  valACYclovir (VALTREX) 1000 MG tablet Take 1 tablet (1,000 mg total) by mouth 2 (two) times daily. 01/24/14   Lattie HawStephen A Beese, MD   BP 103/69  Pulse 83  Temp(Src) 98.7 F (37.1 C) (Oral)  Resp 14  Wt 171 lb (77.565 kg)  SpO2 99%  LMP 01/05/2014 Physical Exam  Genitourinary:    There is rash and tenderness on the right labia. There is no lesion or injury on the right labia. There is tenderness on the left labia. There is no rash, lesion or injury on the  left labia. Vaginal discharge found.  Patient's labia are mildly swollen and diffusely tender.  Right labia reveals herpetiform vesicular eruption as noted on diagram.   Nursing notes and Vital Signs reviewed. Appearance:  Patient appears healthy, stated age, and uncomfortable but in no acute distress Eyes:  Pupils are equal, round, and reactive to light and accomodation.  Extraocular movement is intact.  Conjunctivae are not inflamed   Pharynx:  Normal Neck:  Supple.  No adenopathy Lungs:  Clear to auscultation.  Breath sounds are equal.  Heart:  Regular rate and rhythm  without murmurs, rubs, or gallops.  Abdomen:  Nontender without masses or hepatosplenomegaly.  Bowel sounds are present.  No CVA or flank tenderness.  Extremities:  No edema.  No calf tenderness    ED Course  Procedures  none    Labs Reviewed  HERPES SIMPLEX VIRUS CULTURE  GC/CHLAMYDIA PROBE AMP, GENITAL POCT KOH/wet prep:  Positive pseudohyphae, 2-4 WBC/HPF; negative trich; negative clue cells    Results for orders placed during the hospital encounter of 09/10/13  POCT URINE PREGNANCY      Result Value Ref Range   Preg Test, Ur Negative          MDM   1. Discharge of vagina:  Candida vaginitis   2. Vaginal pain; suspect initial episode HSV    HSV viral culture of labial lesions pending.  Begin empiric Valtrex. Unable to perform speculum exam because of patient's labial pain; obtained GC/chlamydia, KOH/wet prep specimens by introital collection. Will administer empiric Rocephin 250mg  IM.  Begin doxycycline 100mg  BID for one week. Rx for Diflucan May take Ibuprofen 200mg , 4 tabs every 8 hours with food.  Try cold compresses for swelling.  For further information go to the website of the [employer]. Followup with PCP or GYN if not improving 3 to 4 days.    Lattie HawStephen A Beese, MD 01/26/14 1125

## 2014-01-25 LAB — GC/CHLAMYDIA PROBE AMP
CT PROBE, AMP APTIMA: POSITIVE — AB
GC Probe RNA: NEGATIVE

## 2014-01-26 LAB — HERPES SIMPLEX VIRUS CULTURE: Organism ID, Bacteria: DETECTED

## 2014-01-27 ENCOUNTER — Encounter (HOSPITAL_COMMUNITY): Payer: Self-pay | Admitting: *Deleted

## 2014-01-27 ENCOUNTER — Telehealth: Payer: Self-pay | Admitting: *Deleted

## 2014-01-27 ENCOUNTER — Inpatient Hospital Stay (HOSPITAL_COMMUNITY)
Admission: AD | Admit: 2014-01-27 | Discharge: 2014-01-27 | Disposition: A | Discharge status: Home / Self Care | Payer: 59 | Source: Ambulatory Visit | Attending: Obstetrics & Gynecology | Admitting: Obstetrics & Gynecology

## 2014-01-27 DIAGNOSIS — A5619 Other chlamydial genitourinary infection: Secondary | ICD-10-CM | POA: Insufficient documentation

## 2014-01-27 DIAGNOSIS — N949 Unspecified condition associated with female genital organs and menstrual cycle: Secondary | ICD-10-CM | POA: Insufficient documentation

## 2014-01-27 DIAGNOSIS — A749 Chlamydial infection, unspecified: Secondary | ICD-10-CM

## 2014-01-27 DIAGNOSIS — A6000 Herpesviral infection of urogenital system, unspecified: Secondary | ICD-10-CM | POA: Insufficient documentation

## 2014-01-27 DIAGNOSIS — N739 Female pelvic inflammatory disease, unspecified: Secondary | ICD-10-CM | POA: Insufficient documentation

## 2014-01-27 DIAGNOSIS — F17200 Nicotine dependence, unspecified, uncomplicated: Secondary | ICD-10-CM | POA: Insufficient documentation

## 2014-01-27 DIAGNOSIS — K219 Gastro-esophageal reflux disease without esophagitis: Secondary | ICD-10-CM | POA: Insufficient documentation

## 2014-01-27 DIAGNOSIS — M79609 Pain in unspecified limb: Secondary | ICD-10-CM | POA: Insufficient documentation

## 2014-01-27 LAB — URINALYSIS, ROUTINE W REFLEX MICROSCOPIC
Bilirubin Urine: NEGATIVE
Glucose, UA: NEGATIVE mg/dL
KETONES UR: NEGATIVE mg/dL
Nitrite: NEGATIVE
PROTEIN: NEGATIVE mg/dL
Specific Gravity, Urine: >1.03 — ABNORMAL HIGH (ref 1.005–1.030)
UROBILINOGEN UA: 0.2 mg/dL (ref 0.0–1.0)
pH: 5.5 (ref 5.0–8.0)

## 2014-01-27 LAB — URINE MICROSCOPIC-ADD ON

## 2014-01-27 LAB — POCT PREGNANCY, URINE: PREG TEST UR: NEGATIVE

## 2014-01-27 MED ORDER — OXYCODONE-ACETAMINOPHEN 5-325 MG PO TABS: 1.0000 | ORAL_TABLET | Freq: Four times a day (QID) | ORAL | Status: DC | PRN

## 2014-01-27 MED ORDER — HYDROMORPHONE HCL PF 1 MG/ML IJ SOLN
1.0000 mg | Freq: Once | INTRAMUSCULAR | Status: AC
  Administered 2014-01-27: 1 mg via INTRAMUSCULAR
  Filled 2014-01-27: qty 1

## 2014-01-27 MED ORDER — LIDOCAINE HCL 2 % EX GEL: 1.0000 "application " | CUTANEOUS | Status: DC | PRN

## 2014-01-27 NOTE — MAU Provider Note (Signed)
History     CSN: [id number]  Arrival date and time: 01/27/14 1126   None     Chief Complaint  Patient presents with  . Fever  . Leg Pain   HPI This is a .23 y.o. 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 States has had in increase in number of lesions and is in a lot of pain. Had a fever at home.  Was treated but ran out of lidocaine gel.   RN Note:  Pt dx with herpes, chlamydia this week. Given abx. Still ws having pelvic and vaginal pain given dilaudid injection and lidocaine gel for pain. Stated now she feel worse. Stated her temp has been elevated High as 101.s average around 100.1 c/o hot and cold chills and cramping g in her legs       OB History   Grav Para Term Preterm Abortions TAB SAB Ect Mult Living   1 1 1       1       Past Medical History  Diagnosis Date  . GERD (gastroesophageal reflux disease)   . Gastritis   . Depression     History reviewed. No pertinent past surgical history.  History reviewed. No pertinent family history.  History  Substance Use Topics  . Smoking status: Current Every Day Smoker  . Smokeless tobacco: Never Used  . Alcohol Use: Yes    Allergies: No Known Allergies  Prescriptions prior to admission  Medication Sig Dispense Refill  . doxycycline (VIBRAMYCIN) 100 MG capsule Take 1 capsule (100 mg total) by mouth 2 (two) times daily.  14 capsule  0  . HYDROcodone-acetaminophen (NORCO/VICODIN) 5-325 MG per tablet Take 1 tablet by mouth every 6 (six) hours as needed for moderate pain.  10 tablet  0  . ibuprofen (ADVIL,MOTRIN) 200 MG tablet Take 800 mg by mouth daily.      . valACYclovir (VALTREX) 1000 MG tablet Take 1 tablet (1,000 mg total) by mouth 2 (two) times daily.  20 tablet  0  . levonorgestrel (MIRENA) 20 MCG/24HR IUD 1 each by Intrauterine route once.        Review of Systems  Constitutional: Positive for fever and chills. Negative for malaise/fatigue.  Gastrointestinal: Positive for  abdominal pain. Negative for nausea and vomiting.  Genitourinary:       Pain on perineum and in thighs, radiating down legs   Physical Exam   Blood pressure 117/65, pulse 93, temperature 98.7 F (37.1 C), temperature source Oral, resp. rate 18, height 5\' 4"  (1.626 m), weight 77.928 kg (171 lb 12.8 oz), last menstrual period 01/05/2014.  Physical Exam  Constitutional: She is oriented to person, place, and time. She appears well-developed and well-nourished. She appears distressed (with discomfort).  Cardiovascular: Normal rate.   Respiratory: Effort normal.  GI: Soft.  Genitourinary:  Declines exam  Musculoskeletal: Normal range of motion.  Neurological: She is alert and oriented to person, place, and time.  Skin: Skin is warm and dry.  Psychiatric: She has a normal mood and affect.    MAU Course  Procedures  MDM Results for orders placed during the hospital encounter of 01/27/14 (from the past 24 hour(s))  URINALYSIS, ROUTINE W REFLEX MICROSCOPIC     Status: Abnormal   Collection Time    01/27/14 11:35 AM      Result Value Ref Range   Color, Urine YELLOW  YELLOW   APPearance CLEAR  CLEAR   Specific Gravity, Urine >1.030 (*)  1.005 - 1.030   pH 5.5  5.0 - 8.0   Glucose, UA NEGATIVE  NEGATIVE mg/dL   Hgb urine dipstick TRACE (*) NEGATIVE   Bilirubin Urine NEGATIVE  NEGATIVE   Ketones, ur NEGATIVE  NEGATIVE mg/dL   Protein, ur NEGATIVE  NEGATIVE mg/dL   Urobilinogen, UA 0.2  0.0 - 1.0 mg/dL   Nitrite NEGATIVE  NEGATIVE   Leukocytes, UA SMALL (*) NEGATIVE  URINE MICROSCOPIC-ADD ON     Status: Abnormal   Collection Time    01/27/14 11:35 AM      Result Value Ref Range   Squamous Epithelial / LPF FEW (*) RARE   WBC, UA 7-10  <3 WBC/hpf   RBC / HPF 0-2  <3 RBC/hpf  POCT PREGNANCY, URINE     Status: None   Collection Time    01/27/14 11:46 AM      Result Value Ref Range   Preg Test, Ur NEGATIVE  NEGATIVE     Assessment and Plan  A:  Primary Herpes outbreak       Leg  pain, probably neurogenic       Chlamydia, treated  P:  Will give new Rx Xylocaine       Rx Percocet       Continue Valtrex  Aviva SignsMarie L Lissa Rowles 01/27/2014, 12:01 PM

## 2014-01-27 NOTE — MAU Note (Signed)
Pt dx with herpes, chlamydia this week. Given abx. Still ws having pelvic and vaginal pain given dilaudid injection and lidocaine gel for pain. Stated now she feel worse. Stated her temp has been elevated  High as 101.s average around 100.1 c/o hot and cold chills and cramping g in her legs

## 2014-01-27 NOTE — Discharge Instructions (Signed)
Genital Herpes  Genital herpes is a sexually transmitted disease. This means that it is a disease passed by having sex with an infected person. There is no cure for genital herpes. The time between attacks can be months to years. The virus may live in a person but produce no problems (symptoms). This infection can be passed to a baby as it travels down the birth canal (vagina). In a newborn, this can cause central nervous system damage, eye damage, or even death. The virus that causes genital herpes is usually HSV-2 virus. The virus that causes oral herpes is usually HSV-1. The diagnosis (learning what is wrong) is made through culture results.  SYMPTOMS   Usually symptoms of pain and itching begin a few days to a week after contact. It first appears as small blisters that progress to small painful ulcers which then scab over and heal after several days. It affects the outer genitalia, birth canal, cervix, penis, anal area, buttocks, and thighs.  HOME CARE INSTRUCTIONS   · Keep ulcerated areas dry and clean.  · Take medications as directed. Antiviral medications can speed up healing. They will not prevent recurrences or cure this infection. These medications can also be taken for suppression if there are frequent recurrences.  · While the infection is active, it is contagious. Avoid all sexual contact during active infections.  · Condoms may help prevent spread of the herpes virus.  · Practice safe sex.  · Wash your hands thoroughly after touching the genital area.  · Avoid touching your eyes after touching your genital area.  · Inform your caregiver if you have had genital herpes and become pregnant. It is your responsibility to insure a safe outcome for your baby in this pregnancy.  · Only take over-the-counter or prescription medicines for pain, discomfort, or fever as directed by your caregiver.  SEEK MEDICAL CARE IF:   · You have a recurrence of this infection.  · You do not respond to medications and are not  improving.  · You have new sources of pain or discharge which have changed from the original infection.  · You have an oral temperature above 102° F (38.9° C).  · You develop abdominal pain.  · You develop eye pain or signs of eye infection.  Document Released: 09/20/2000 Document Revised: 12/16/2011 Document Reviewed: 10/11/2009  ExitCare® Patient Information ©2014 [employer].

## 2014-08-08 ENCOUNTER — Encounter (HOSPITAL_COMMUNITY): Payer: Self-pay | Admitting: *Deleted

## 2014-11-17 ENCOUNTER — Inpatient Hospital Stay (HOSPITAL_COMMUNITY)
Admission: AD | Admit: 2014-11-17 | Discharge: 2014-11-17 | Disposition: A | Discharge status: Home / Self Care | Payer: 59 | Source: Ambulatory Visit | Attending: Obstetrics & Gynecology | Admitting: Obstetrics & Gynecology

## 2014-11-17 ENCOUNTER — Inpatient Hospital Stay (HOSPITAL_COMMUNITY): Payer: 59

## 2014-11-17 ENCOUNTER — Encounter (HOSPITAL_COMMUNITY): Payer: Self-pay | Admitting: *Deleted

## 2014-11-17 DIAGNOSIS — N8320 Unspecified ovarian cysts: Secondary | ICD-10-CM | POA: Diagnosis not present

## 2014-11-17 DIAGNOSIS — Z975 Presence of (intrauterine) contraceptive device: Secondary | ICD-10-CM | POA: Insufficient documentation

## 2014-11-17 DIAGNOSIS — R1032 Left lower quadrant pain: Secondary | ICD-10-CM | POA: Diagnosis not present

## 2014-11-17 DIAGNOSIS — R109 Unspecified abdominal pain: Secondary | ICD-10-CM | POA: Diagnosis present

## 2014-11-17 DIAGNOSIS — F17210 Nicotine dependence, cigarettes, uncomplicated: Secondary | ICD-10-CM | POA: Insufficient documentation

## 2014-11-17 DIAGNOSIS — N83209 Unspecified ovarian cyst, unspecified side: Secondary | ICD-10-CM

## 2014-11-17 LAB — CBC WITH DIFFERENTIAL/PLATELET
Basophils Absolute: 0 10*3/uL (ref 0.0–0.1)
Basophils Relative: 1 % (ref 0–1)
Eosinophils Absolute: 0 10*3/uL (ref 0.0–0.7)
Eosinophils Relative: 1 % (ref 0–5)
HCT: 39.4 % (ref 36.0–46.0)
HEMOGLOBIN: 13.4 g/dL (ref 12.0–15.0)
LYMPHS ABS: 1.9 10*3/uL (ref 0.7–4.0)
LYMPHS PCT: 35 % (ref 12–46)
MCH: 33.3 pg (ref 26.0–34.0)
MCHC: 34 g/dL (ref 30.0–36.0)
MCV: 97.8 fL (ref 78.0–100.0)
MONO ABS: 0.5 10*3/uL (ref 0.1–1.0)
MONOS PCT: 8 % (ref 3–12)
NEUTROS ABS: 3 10*3/uL (ref 1.7–7.7)
NEUTROS PCT: 55 % (ref 43–77)
Platelets: 202 10*3/uL (ref 150–400)
RBC: 4.03 MIL/uL (ref 3.87–5.11)
RDW: 12.3 % (ref 11.5–15.5)
WBC: 5.4 10*3/uL (ref 4.0–10.5)

## 2014-11-17 LAB — URINALYSIS, ROUTINE W REFLEX MICROSCOPIC
Bilirubin Urine: NEGATIVE
GLUCOSE, UA: NEGATIVE mg/dL
Hgb urine dipstick: NEGATIVE
KETONES UR: NEGATIVE mg/dL
LEUKOCYTES UA: NEGATIVE
Nitrite: NEGATIVE
Protein, ur: NEGATIVE mg/dL
Specific Gravity, Urine: 1.01 (ref 1.005–1.030)
Urobilinogen, UA: 0.2 mg/dL (ref 0.0–1.0)
pH: 6.5 (ref 5.0–8.0)

## 2014-11-17 LAB — POCT PREGNANCY, URINE: Preg Test, Ur: NEGATIVE

## 2014-11-17 MED ORDER — ONDANSETRON HCL 4 MG/2ML IJ SOLN
4.0000 mg | Freq: Once | INTRAMUSCULAR | Status: AC
  Administered 2014-11-17: 4 mg via INTRAVENOUS
  Filled 2014-11-17: qty 2

## 2014-11-17 MED ORDER — VALACYCLOVIR HCL 1 G PO TABS: 1000.0000 mg | ORAL_TABLET | Freq: Every day | ORAL | Status: DC | PRN

## 2014-11-17 MED ORDER — IBUPROFEN 800 MG PO TABS: 800.0000 mg | ORAL_TABLET | Freq: Three times a day (TID) | ORAL | Status: DC

## 2014-11-17 MED ORDER — LACTATED RINGERS IV SOLN
INTRAVENOUS | Status: DC
  Administered 2014-11-17: 15:00:00 via INTRAVENOUS

## 2014-11-17 MED ORDER — HYDROMORPHONE HCL 1 MG/ML IJ SOLN
1.0000 mg | Freq: Once | INTRAMUSCULAR | Status: AC
  Administered 2014-11-17: 1 mg via INTRAVENOUS
  Filled 2014-11-17: qty 1

## 2014-11-17 MED ORDER — KETOROLAC TROMETHAMINE 30 MG/ML IJ SOLN
30.0000 mg | Freq: Once | INTRAMUSCULAR | Status: AC
  Administered 2014-11-17: 30 mg via INTRAVENOUS
  Filled 2014-11-17: qty 1

## 2014-11-17 MED ORDER — OXYCODONE-ACETAMINOPHEN 5-325 MG PO TABS: 1.0000 | ORAL_TABLET | ORAL | Status: DC | PRN

## 2014-11-17 MED ORDER — OXYCODONE-ACETAMINOPHEN 5-325 MG PO TABS
2.0000 | ORAL_TABLET | Freq: Once | ORAL | Status: AC
  Administered 2014-11-17: 2 via ORAL
  Filled 2014-11-17: qty 2

## 2014-11-17 NOTE — Discharge Instructions (Signed)
You've been diagnosed with a 5 cm simple ovarian cyst. Most simple cysts go away without intervention. I've ordered follow-up ultrasound and follow-up appointment in [facility] outpatient gynecology clinic in 6-8 weeks to reevaluate the cyst.  Ovarian Cyst An ovarian cyst is a fluid-filled sac that forms on an ovary. The ovaries are small organs that produce eggs in women. Various types of cysts can form on the ovaries. Most are not cancerous. Many do not cause problems, and they often go away on their own. Some may cause symptoms and require treatment. Common types of ovarian cysts include:  Functional cysts--These cysts may occur every month during the menstrual cycle. This is normal. The cysts usually go away with the next menstrual cycle if the woman does not get pregnant. Usually, there are no symptoms with a functional cyst.  Endometrioma cysts--These cysts form from the tissue that lines the uterus. They are also called "chocolate cysts" because they become filled with blood that turns brown. This type of cyst can cause pain in the lower abdomen during intercourse and with your menstrual period.  Cystadenoma cysts--This type develops from the cells on the outside of the ovary. These cysts can get very big and cause lower abdomen pain and pain with intercourse. This type of cyst can twist on itself, cut off its blood supply, and cause severe pain. It can also easily rupture and cause a lot of pain.  Dermoid cysts--This type of cyst is sometimes found in both ovaries. These cysts may contain different kinds of body tissue, such as skin, teeth, hair, or cartilage. They usually do not cause symptoms unless they get very big.  Theca lutein cysts--These cysts occur when too much of a certain hormone (human chorionic gonadotropin) is produced and overstimulates the ovaries to produce an egg. This is most common after procedures used to assist with the conception of a baby (in vitro  fertilization). CAUSES   Fertility drugs can cause a condition in which multiple large cysts are formed on the ovaries. This is called ovarian hyperstimulation syndrome.  A condition called polycystic ovary syndrome can cause hormonal imbalances that can lead to nonfunctional ovarian cysts. SIGNS AND SYMPTOMS  Many ovarian cysts do not cause symptoms. If symptoms are present, they may include:  Pelvic pain or pressure.  Pain in the lower abdomen.  Pain during sexual intercourse.  Increasing girth (swelling) of the abdomen.  Abnormal menstrual periods.  Increasing pain with menstrual periods.  Stopping having menstrual periods without being pregnant. DIAGNOSIS  These cysts are commonly found during a routine or annual pelvic exam. Tests may be ordered to find out more about the cyst. These tests may include:  Ultrasound.  X-ray of the pelvis.  CT scan.  MRI.  Blood tests. TREATMENT  Many ovarian cysts go away on their own without treatment. Your health care provider may want to check your cyst regularly for 2-3 months to see if it changes. For women in menopause, it is particularly important to monitor a cyst closely because of the higher rate of ovarian cancer in menopausal women. When treatment is needed, it may include any of the following:  A procedure to drain the cyst (aspiration). This may be done using a long needle and ultrasound. It can also be done through a laparoscopic procedure. This involves using a thin, lighted tube with a tiny camera on the end (laparoscope) inserted through a small incision.  Surgery to remove the whole cyst. This may be done using laparoscopic  surgery or an open surgery involving a larger incision in the lower abdomen.  Hormone treatment or birth control pills. These methods are sometimes used to help dissolve a cyst. HOME CARE INSTRUCTIONS   Only take over-the-counter or prescription medicines as directed by your health care  provider.  Follow up with your health care provider as directed.  Get regular pelvic exams and Pap tests. SEEK MEDICAL CARE IF:   Your periods are late, irregular, or painful, or they stop.  Your pelvic pain or abdominal pain does not go away.  Your abdomen becomes larger or swollen.  You have pressure on your bladder or trouble emptying your bladder completely.  You have pain during sexual intercourse.  You have feelings of fullness, pressure, or discomfort in your stomach.  You lose weight for no apparent reason.  You feel generally ill.  You become constipated.  You lose your appetite.  You develop acne.  You have an increase in body and facial hair.  You are gaining weight, without changing your exercise and eating habits.  You think you are pregnant. SEEK IMMEDIATE MEDICAL CARE IF:   You have increasing abdominal pain.  You feel sick to your stomach (nauseous), and you throw up (vomit).  You develop a fever that comes on suddenly.  You have abdominal pain during a bowel movement.  Your menstrual periods become heavier than usual. MAKE SURE YOU:  Understand these instructions.  Will watch your condition.  Will get help right away if you are not doing well or get worse. Document Released: 09/23/2005 Document Revised: 09/28/2013 Document Reviewed: 05/31/2013 [facility] Patient Information 2015 [place], [place]. This information is not intended to replace advice given to you by your health care provider. Make sure you discuss any questions you have with your health care provider.

## 2014-11-17 NOTE — MAU Provider Note (Signed)
History     CSN: [id number]  Arrival date and time: 11/17/14 1414   First Provider Initiated Contact with Patient 11/17/14 1456     Chief Complaint  Patient presents with  . Abdominal Pain  . Back Pain   Abdominal Pain Associated symptoms include diarrhea, a fever (Self reported monday night at 102.6) and nausea. Pertinent negatives include no constipation, dysuria, frequency, headaches, hematuria or vomiting.  Back Pain Associated symptoms include abdominal pain and a fever (Self reported monday night at 102.6). Pertinent negatives include no chest pain, dysuria or headaches.    Pt is a 25y/o G1P1001 who presents to the MAU for abdominal pain. States that the abdominal pain began as intermittent "crampy" feeling on Saturday and progressed to constant tearing pain that was a 10/10 on Tuesday. Mainly localized to the LLQ with diffuse pain on the RLQ and LUQ.   Pt went to the [facility] ED on Tuesday after having a self reported fever of 102.6 and passing a large palm-sized blood clot Tuesday morning with severe LLQ abdominal pain. Negative pregnancy test. 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 Ultrasound showed a "right ovarian cyst with normal blood flow to the right ovary, as well as the left ovary not being well visualized. No abnormality in the left adnexa." Pt. Diagnosed with PID and sent home with antibiotics.   Despite treatment, Pt continued to have the symptoms of abdominal pain and passed another large clot today around 1230. Pt has not eaten anything of substance since Tuesday. Persistent nausea with dry heaving. Had 2-3 bouts of watery diarrhea yesterday which she attributes to antibiotics given for PID. No further bowel movements since then. No hematochezia. Voiding well with no dysuria or increase in frequency.         Past Medical History  Diagnosis Date  . GERD (gastroesophageal reflux disease)   . Gastritis   .  Depression     Past Surgical History  Procedure Laterality Date  . No past surgeries      History reviewed. No pertinent family history.  History  Substance Use Topics  . Smoking status: Current Every Day Smoker  . Smokeless tobacco: Never Used  . Alcohol Use: Yes     Comment: occasional    Allergies:  Allergies  Allergen Reactions  . Toradol [Ketorolac Tromethamine] Nausea And Vomiting    Prescriptions prior to admission  Medication Sig Dispense Refill Last Dose  . doxycycline (VIBRAMYCIN) 100 MG capsule Take 1 capsule (100 mg total) by mouth 2 (two) times daily. (Patient not taking: Reported on 11/17/2014) 14 capsule 0 Completed Course at Unknown time  . levonorgestrel (MIRENA) 20 MCG/24HR IUD 1 each by Intrauterine route once.     . lidocaine (XYLOCAINE JELLY) 2 % jelly Apply 1 application topically as needed. (Patient not taking: Reported on 11/17/2014) 30 mL 0 Completed Course at Unknown time  . oxyCODONE-acetaminophen (PERCOCET/ROXICET) 5-325 MG per tablet Take 1-2 tablets by mouth every 6 (six) hours as needed. (Patient not taking: Reported on 11/17/2014) 30 tablet 0 Completed Course at Unknown time  . valACYclovir (VALTREX) 1000 MG tablet Take 1 tablet (1,000 mg total) by mouth 2 (two) times daily. (Patient taking differently: Take 1,000 mg by mouth daily as needed. ) 20 tablet 0 01/27/2014 at Unknown time    Review of Systems  Constitutional: Positive for fever (Self reported monday night at 102.6).  Respiratory: Negative for cough.   Cardiovascular: Negative for  chest pain.  Gastrointestinal: Positive for nausea, abdominal pain and diarrhea. Negative for vomiting and constipation.  Genitourinary: Negative for dysuria, frequency and hematuria.  Musculoskeletal: Positive for back pain.  Neurological: Negative for headaches.   Physical Exam   Blood pressure 107/54, pulse 63, temperature 98.3 F (36.8 C), temperature source Oral, resp. rate 20, last menstrual period  11/02/2014.  Physical Exam  Nursing note and vitals reviewed. Constitutional: She is oriented to person, place, and time. She appears well-developed and well-nourished. She appears distressed.  HENT:  Head: Normocephalic.  Eyes: Conjunctivae are normal.  Cardiovascular: Normal rate and regular rhythm.   Respiratory: Effort normal and breath sounds normal.  GI: Soft. Normal appearance and bowel sounds are normal. She exhibits no distension and no mass. There is tenderness in the left lower quadrant. There is guarding. There is no rebound, no tenderness at McBurney's point and negative Murphy's sign.  Genitourinary: There is no rash or lesion on the right labia. There is no rash or lesion on the left labia. Uterus is not enlarged and not tender. Cervix exhibits friability. Cervix exhibits no motion tenderness and no discharge. Right adnexum displays mass, tenderness and fullness. Left adnexum displays tenderness. Left adnexum displays no mass. There is bleeding (scant bleeding from friable cervix. No blood coming from os) in the vagina. No erythema in the vagina. No vaginal discharge found.  Neurological: She is alert and oriented to person, place, and time.  Skin: Skin is warm and dry.   Results for orders placed or performed during the hospital encounter of 11/17/14 (from the past 24 hour(s))  Urinalysis, Routine w reflex microscopic     Status: None   Collection Time: 11/17/14  2:41 PM  Result Value Ref Range   Color, Urine YELLOW YELLOW   APPearance CLEAR CLEAR   Specific Gravity, Urine 1.010 1.005 - 1.030   pH 6.5 5.0 - 8.0   Glucose, UA NEGATIVE NEGATIVE mg/dL   Hgb urine dipstick NEGATIVE NEGATIVE   Bilirubin Urine NEGATIVE NEGATIVE   Ketones, ur NEGATIVE NEGATIVE mg/dL   Protein, ur NEGATIVE NEGATIVE mg/dL   Urobilinogen, UA 0.2 0.0 - 1.0 mg/dL   Nitrite NEGATIVE NEGATIVE   Leukocytes, UA NEGATIVE NEGATIVE  CBC with Differential/Platelet     Status: None   Collection Time:  11/17/14  2:47 PM  Result Value Ref Range   WBC 5.4 4.0 - 10.5 K/uL   RBC 4.03 3.87 - 5.11 MIL/uL   Hemoglobin 13.4 12.0 - 15.0 g/dL   HCT 16.1 09.6 - 04.5 %   MCV 97.8 78.0 - 100.0 fL   MCH 33.3 26.0 - 34.0 pg   MCHC 34.0 30.0 - 36.0 g/dL   RDW 40.9 81.1 - 91.4 %   Platelets 202 150 - 400 K/uL   Neutrophils Relative % 55 43 - 77 %   Neutro Abs 3.0 1.7 - 7.7 K/uL   Lymphocytes Relative 35 12 - 46 %   Lymphs Abs 1.9 0.7 - 4.0 K/uL   Monocytes Relative 8 3 - 12 %   Monocytes Absolute 0.5 0.1 - 1.0 K/uL   Eosinophils Relative 1 0 - 5 %   Eosinophils Absolute 0.0 0.0 - 0.7 K/uL   Basophils Relative 1 0 - 1 %   Basophils Absolute 0.0 0.0 - 0.1 K/uL  Pregnancy, urine POC     Status: None   Collection Time: 11/17/14  2:48 PM  Result Value Ref Range   Preg Test, Ur NEGATIVE NEGATIVE   US  Transvaginal Non-ob  11/17/2014   CLINICAL DATA:  Left lower quadrant abdominal pain. Questionable ovarian torsion. History of an ovarian cyst.  EXAM: TRANSABDOMINAL AND TRANSVAGINAL ULTRASOUND OF PELVIS  DOPPLER ULTRASOUND OF OVARIES  TECHNIQUE: Both transabdominal and transvaginal ultrasound examinations of the pelvis were performed. Transabdominal technique was performed for global imaging of the pelvis including uterus, ovaries, adnexal regions, and pelvic cul-de-sac.  It was necessary to proceed with endovaginal exam following the transabdominal exam to visualize the the endometrium and ovaries to better advantage. Color and duplex Doppler ultrasound was utilized to evaluate blood flow to the ovaries.  COMPARISON:  None.  FINDINGS: Uterus  Measurements: 8.8 cm x 4.1 cm x 5.2 cm. No fibroids or other mass visualized.  Endometrium  Thickness: 6.1 mm. Mirena IUD well-positioned. No endometrial mass or fluid.  Right ovary  Measurements: 5.8 cm x 4.5 cm x 5.1 cm. Ovary enlarged by a simple appearing cyst measuring 5.4 cm x 4.7 cm x 4.6 cm. No other ovarian abnormality. No adnexal mass.  Left ovary  Measurements:  2.9 cm x 2.6 cm x 2.3 cm. Normal appearance/no adnexal mass.  Pulsed Doppler evaluation of both ovaries demonstrates normal low-resistance arterial and venous waveforms.  Other findings  No free fluid.  IMPRESSION: 1. No evidence of ovarian torsion.  No acute findings. 2. 5.4 cm right ovarian simple appearing cyst. This is almost certainly benign, but follow up ultrasound is recommended in 1 year according to the Society of Radiologists in Ultrasound2010 Consensus Conference Statement (D Lenis Noon et al. Management of Asymptomatic Ovarian and Other Adnexal Cysts Imaged at [place]: Society of Radiologists in Ultrasound Consensus Conference Statement 2010. Radiology 256 (Sept 2010): 943-954.). 3. No other abnormalities.  IUD is well positioned.   Electronically Signed   By: Amie Portland M.D.   On: 11/17/2014 17:07   US Pelvis Complete  11/17/2014   CLINICAL DATA:  Left lower quadrant abdominal pain. Questionable ovarian torsion. History of an ovarian cyst.  EXAM: TRANSABDOMINAL AND TRANSVAGINAL ULTRASOUND OF PELVIS  DOPPLER ULTRASOUND OF OVARIES  TECHNIQUE: Both transabdominal and transvaginal ultrasound examinations of the pelvis were performed. Transabdominal technique was performed for global imaging of the pelvis including uterus, ovaries, adnexal regions, and pelvic cul-de-sac.  It was necessary to proceed with endovaginal exam following the transabdominal exam to visualize the the endometrium and ovaries to better advantage. Color and duplex Doppler ultrasound was utilized to evaluate blood flow to the ovaries.  COMPARISON:  None.  FINDINGS: Uterus  Measurements: 8.8 cm x 4.1 cm x 5.2 cm. No fibroids or other mass visualized.  Endometrium  Thickness: 6.1 mm. Mirena IUD well-positioned. No endometrial mass or fluid.  Right ovary  Measurements: 5.8 cm x 4.5 cm x 5.1 cm. Ovary enlarged by a simple appearing cyst measuring 5.4 cm x 4.7 cm x 4.6 cm. No other ovarian abnormality. No adnexal mass.  Left ovary   Measurements: 2.9 cm x 2.6 cm x 2.3 cm. Normal appearance/no adnexal mass.  Pulsed Doppler evaluation of both ovaries demonstrates normal low-resistance arterial and venous waveforms.  Other findings  No free fluid.  IMPRESSION: 1. No evidence of ovarian torsion.  No acute findings. 2. 5.4 cm right ovarian simple appearing cyst. This is almost certainly benign, but follow up ultrasound is recommended in 1 year according to the Society of Radiologists in Ultrasound2010 Consensus Conference Statement Grier Rocher et al. Management of Asymptomatic Ovarian and Other Adnexal Cysts Imaged at [place]: Society of Radiologists in Ultrasound Consensus Conference Statement  2010. Radiology 256 (Sept 2010): 943-954.). 3. No other abnormalities.  IUD is well positioned.   Electronically Signed   By: Amie Portland M.D.   On: 11/17/2014 17:07   [place] Art/ven Flow Abd Pelv Doppler  11/17/2014   CLINICAL DATA:  Left lower quadrant abdominal pain. Questionable ovarian torsion. History of an ovarian cyst.  EXAM: TRANSABDOMINAL AND TRANSVAGINAL ULTRASOUND OF PELVIS  DOPPLER ULTRASOUND OF OVARIES  TECHNIQUE: Both transabdominal and transvaginal ultrasound examinations of the pelvis were performed. Transabdominal technique was performed for global imaging of the pelvis including uterus, ovaries, adnexal regions, and pelvic cul-de-sac.  It was necessary to proceed with endovaginal exam following the transabdominal exam to visualize the the endometrium and ovaries to better advantage. Color and duplex Doppler ultrasound was utilized to evaluate blood flow to the ovaries.  COMPARISON:  None.  FINDINGS: Uterus  Measurements: 8.8 cm x 4.1 cm x 5.2 cm. No fibroids or other mass visualized.  Endometrium  Thickness: 6.1 mm. Mirena IUD well-positioned. No endometrial mass or fluid.  Right ovary  Measurements: 5.8 cm x 4.5 cm x 5.1 cm. Ovary enlarged by a simple appearing cyst measuring 5.4 cm x 4.7 cm x 4.6 cm. No other ovarian abnormality. No adnexal  mass.  Left ovary  Measurements: 2.9 cm x 2.6 cm x 2.3 cm. Normal appearance/no adnexal mass.  Pulsed Doppler evaluation of both ovaries demonstrates normal low-resistance arterial and venous waveforms.  Other findings  No free fluid.  IMPRESSION: 1. No evidence of ovarian torsion.  No acute findings. 2. 5.4 cm right ovarian simple appearing cyst. This is almost certainly benign, but follow up ultrasound is recommended in 1 year according to the Society of Radiologists in Ultrasound2010 Consensus Conference Statement (D Lenis Noon et al. Management of Asymptomatic Ovarian and Other Adnexal Cysts Imaged at [place]: Society of Radiologists in Ultrasound Consensus Conference Statement 2010. Radiology 256 (Sept 2010): 943-954.). 3. No other abnormalities.  IUD is well positioned.   Electronically Signed   By: Amie Portland M.D.   On: 11/17/2014 17:07   MAU Course  Procedures  -CBC -[place] w/ Doppler -UA -Toradol -Zofran   ASSESSMENT    1. LLQ abdominal pain   2. Ovarian cyst    PLAN Discharge home in stable condition per consult with Dr. Macon Large Torsion precautions. Instructed to take ibuprofen around the clock on schedule and prescribed Percocet as needed for breakthrough pain. Encouraged use sparingly. Follow-up Information    Follow up with [facility].   Specialty:  Obstetrics and Gynecology   Why:  Will call you to schedule an appointment in 6-8 weeks to reevaluate ovarian cyst   Contact information:   [address] [place] [address] [phone]      Follow up with [facility] [place] ULTRASOUND.   Specialty:  Radiology   Why:  Will call you to schedule a follow-up ultrasound in 6-8 weeks to reevaluate ovarian cyst   Contact information:   [address] [id number] mc [place] [address] [phone]      Follow up with [facility] [place] MATERNITY ADMISSIONS.   Why:  As needed for gynecologic emergencies    Contact information:   [address] [id number] mc [place] [address] [phone]        Medication List    STOP taking these medications        doxycycline 100 MG capsule  Commonly known as:  VIBRAMYCIN     lidocaine  2 % jelly  Commonly known as:  XYLOCAINE JELLY      TAKE these medications        ibuprofen 800 MG tablet  Commonly known as:  ADVIL,MOTRIN  Take 1 tablet (800 mg total) by mouth 3 (three) times daily. Take on schedule.     levonorgestrel 20 MCG/24HR IUD  Commonly known as:  MIRENA  1 each by Intrauterine route once.     oxyCODONE-acetaminophen 5-325 MG per tablet  Commonly known as:  PERCOCET/ROXICET  Take 1-2 tablets by mouth every 4 (four) hours as needed (For breakthrough pain).     valACYclovir 1000 MG tablet  Commonly known as:  VALTREX  Take 1 tablet (1,000 mg total) by mouth daily as needed.       [place] Fynlee Rowlands, [place] 2/11/20166:06 PM

## 2014-11-17 NOTE — MAU Note (Signed)
Pt started having severe lower abd pain on Saturday, passed a large blood clot on Tuesday, was seen @ [facility] - pt states she was told she did not have an ovarian cyst but the MD wanted to take her to surgery for a possible ovarian torsion.  Severe pain has continued, passed another blood clot today, now also having back pain.  Has Mirena IUD.

## 2014-11-18 ENCOUNTER — Encounter: Payer: Self-pay | Admitting: Obstetrics and Gynecology

## 2015-01-11 ENCOUNTER — Encounter (HOSPITAL_COMMUNITY): Payer: Self-pay | Admitting: Emergency Medicine

## 2015-01-11 ENCOUNTER — Inpatient Hospital Stay (HOSPITAL_COMMUNITY): Payer: 59

## 2015-01-11 ENCOUNTER — Inpatient Hospital Stay (HOSPITAL_COMMUNITY)
Admission: AD | Admit: 2015-01-11 | Discharge: 2015-01-11 | Disposition: A | Discharge status: Home / Self Care | Payer: 59 | Source: Ambulatory Visit | Attending: Obstetrics and Gynecology | Admitting: Obstetrics and Gynecology

## 2015-01-11 DIAGNOSIS — K219 Gastro-esophageal reflux disease without esophagitis: Secondary | ICD-10-CM | POA: Insufficient documentation

## 2015-01-11 DIAGNOSIS — R1032 Left lower quadrant pain: Secondary | ICD-10-CM | POA: Diagnosis not present

## 2015-01-11 DIAGNOSIS — N8320 Unspecified ovarian cysts: Secondary | ICD-10-CM | POA: Insufficient documentation

## 2015-01-11 DIAGNOSIS — N8329 Other ovarian cysts: Secondary | ICD-10-CM | POA: Diagnosis not present

## 2015-01-11 DIAGNOSIS — Z886 Allergy status to analgesic agent status: Secondary | ICD-10-CM | POA: Diagnosis not present

## 2015-01-11 DIAGNOSIS — N83209 Unspecified ovarian cyst, unspecified side: Secondary | ICD-10-CM

## 2015-01-11 DIAGNOSIS — N83202 Unspecified ovarian cyst, left side: Secondary | ICD-10-CM

## 2015-01-11 DIAGNOSIS — F17210 Nicotine dependence, cigarettes, uncomplicated: Secondary | ICD-10-CM | POA: Insufficient documentation

## 2015-01-11 DIAGNOSIS — R109 Unspecified abdominal pain: Secondary | ICD-10-CM | POA: Diagnosis present

## 2015-01-11 HISTORY — DX: Unspecified ovarian cyst, unspecified side: N83.209

## 2015-01-11 LAB — CBC
HEMATOCRIT: 39.1 % (ref 36.0–46.0)
HEMOGLOBIN: 13.7 g/dL (ref 12.0–15.0)
MCH: 33.1 pg (ref 26.0–34.0)
MCHC: 35 g/dL (ref 30.0–36.0)
MCV: 94.4 fL (ref 78.0–100.0)
PLATELETS: 214 10*3/uL (ref 150–400)
RBC: 4.14 MIL/uL (ref 3.87–5.11)
RDW: 12.3 % (ref 11.5–15.5)
WBC: 8.7 10*3/uL (ref 4.0–10.5)

## 2015-01-11 LAB — URINALYSIS, ROUTINE W REFLEX MICROSCOPIC
Bilirubin Urine: NEGATIVE
Glucose, UA: NEGATIVE mg/dL
Hgb urine dipstick: NEGATIVE
Ketones, ur: NEGATIVE mg/dL
Leukocytes, UA: NEGATIVE
Nitrite: NEGATIVE
PH: 5.5 (ref 5.0–8.0)
Protein, ur: NEGATIVE mg/dL
SPECIFIC GRAVITY, URINE: 1.02 (ref 1.005–1.030)
Urobilinogen, UA: 0.2 mg/dL (ref 0.0–1.0)

## 2015-01-11 LAB — RAPID URINE DRUG SCREEN, HOSP PERFORMED
Amphetamines: NOT DETECTED
BARBITURATES: NOT DETECTED
BENZODIAZEPINES: NOT DETECTED
Cocaine: NOT DETECTED
Opiates: NOT DETECTED
Tetrahydrocannabinol: NOT DETECTED

## 2015-01-11 LAB — POCT PREGNANCY, URINE: Preg Test, Ur: NEGATIVE

## 2015-01-11 MED ORDER — HYDROMORPHONE HCL 2 MG/ML IJ SOLN
2.0000 mg | Freq: Once | INTRAMUSCULAR | Status: AC
  Administered 2015-01-11: 2 mg via INTRAMUSCULAR
  Filled 2015-01-11: qty 1

## 2015-01-11 MED ORDER — IBUPROFEN 600 MG PO TABS: 600.0000 mg | ORAL_TABLET | Freq: Four times a day (QID) | ORAL | Status: DC | PRN

## 2015-01-11 MED ORDER — IBUPROFEN 600 MG PO TABS
600.0000 mg | ORAL_TABLET | Freq: Once | ORAL | Status: AC
  Administered 2015-01-11: 600 mg via ORAL
  Filled 2015-01-11: qty 1

## 2015-01-11 MED ORDER — OXYCODONE-ACETAMINOPHEN 5-325 MG PO TABS: 1.0000 | ORAL_TABLET | Freq: Four times a day (QID) | ORAL | Status: DC | PRN

## 2015-01-11 MED ORDER — IBUPROFEN 400 MG PO TABS: 400.0000 mg | ORAL_TABLET | Freq: Once | ORAL | Status: DC

## 2015-01-11 MED ORDER — PROMETHAZINE HCL 25 MG PO TABS: 12.5000 mg | ORAL_TABLET | Freq: Four times a day (QID) | ORAL | Status: DC | PRN

## 2015-01-11 MED ORDER — PROMETHAZINE HCL 25 MG PO TABS
12.5000 mg | ORAL_TABLET | Freq: Four times a day (QID) | ORAL | Status: DC | PRN
Start: 2015-01-11 — End: 2015-01-11

## 2015-01-11 NOTE — Discharge Instructions (Signed)
Ovarian Cyst An ovarian cyst is a fluid-filled sac that forms on an ovary. The ovaries are small organs that produce eggs in women. Various types of cysts can form on the ovaries. Most are not cancerous. Many do not cause problems, and they often go away on their own. Some may cause symptoms and require treatment. Common types of ovarian cysts include:  Functional cysts--These cysts may occur every month during the menstrual cycle. This is normal. The cysts usually go away with the next menstrual cycle if the woman does not get pregnant. Usually, there are no symptoms with a functional cyst.  Endometrioma cysts--These cysts form from the tissue that lines the uterus. They are also called "chocolate cysts" because they become filled with blood that turns brown. This type of cyst can cause pain in the lower abdomen during intercourse and with your menstrual period.  Cystadenoma cysts--This type develops from the cells on the outside of the ovary. These cysts can get very big and cause lower abdomen pain and pain with intercourse. This type of cyst can twist on itself, cut off its blood supply, and cause severe pain. It can also easily rupture and cause a lot of pain.  Dermoid cysts--This type of cyst is sometimes found in both ovaries. These cysts may contain different kinds of body tissue, such as skin, teeth, hair, or cartilage. They usually do not cause symptoms unless they get very big.  Theca lutein cysts--These cysts occur when too much of a certain hormone (human chorionic gonadotropin) is produced and overstimulates the ovaries to produce an egg. This is most common after procedures used to assist with the conception of a baby (in vitro fertilization). CAUSES   Fertility drugs can cause a condition in which multiple large cysts are formed on the ovaries. This is called ovarian hyperstimulation syndrome.  A condition called polycystic ovary syndrome can cause hormonal imbalances that can lead to  nonfunctional ovarian cysts. SIGNS AND SYMPTOMS  Many ovarian cysts do not cause symptoms. If symptoms are present, they may include:  Pelvic pain or pressure.  Pain in the lower abdomen.  Pain during sexual intercourse.  Increasing girth (swelling) of the abdomen.  Abnormal menstrual periods.  Increasing pain with menstrual periods.  Stopping having menstrual periods without being pregnant. DIAGNOSIS  These cysts are commonly found during a routine or annual pelvic exam. Tests may be ordered to find out more about the cyst. These tests may include:  Ultrasound.  X-ray of the pelvis.  CT scan.  MRI.  Blood tests. TREATMENT  Many ovarian cysts go away on their own without treatment. Your health care provider may want to check your cyst regularly for 2-3 months to see if it changes. For women in menopause, it is particularly important to monitor a cyst closely because of the higher rate of ovarian cancer in menopausal women. When treatment is needed, it may include any of the following:  A procedure to drain the cyst (aspiration). This may be done using a long needle and ultrasound. It can also be done through a laparoscopic procedure. This involves using a thin, lighted tube with a tiny camera on the end (laparoscope) inserted through a small incision.  Surgery to remove the whole cyst. This may be done using laparoscopic surgery or an open surgery involving a larger incision in the lower abdomen.  Hormone treatment or birth control pills. These methods are sometimes used to help dissolve a cyst. HOME CARE INSTRUCTIONS   Only take over-the-counter   or prescription medicines as directed by your health care provider.  Follow up with your health care provider as directed.  Get regular pelvic exams and Pap tests. SEEK MEDICAL CARE IF:   Your periods are late, irregular, or painful, or they stop.  Your pelvic pain or abdominal pain does not go away.  Your abdomen becomes  larger or swollen.  You have pressure on your bladder or trouble emptying your bladder completely.  You have pain during sexual intercourse.  You have feelings of fullness, pressure, or discomfort in your stomach.  You lose weight for no apparent reason.  You feel generally ill.  You become constipated.  You lose your appetite.  You develop acne.  You have an increase in body and facial hair.  You are gaining weight, without changing your exercise and eating habits.  You think you are pregnant. SEEK IMMEDIATE MEDICAL CARE IF:   You have increasing abdominal pain.  You feel sick to your stomach (nauseous), and you throw up (vomit).  You develop a fever that comes on suddenly.  You have abdominal pain during a bowel movement.  Your menstrual periods become heavier than usual. MAKE SURE YOU:  Understand these instructions.  Will watch your condition.  Will get help right away if you are not doing well or get worse. Document Released: 09/23/2005 Document Revised: 09/28/2013 Document Reviewed: 05/31/2013 Exit[employer] Patient Information 2015 [employer]. This information is not intended to replace advice given to you by your health care provider. Make sure you discuss any questions you have with your health care provider.  

## 2015-01-11 NOTE — Progress Notes (Signed)
Pt requested a urine drug screen on herself.  She stated she smoked marijuana but has quit about a month ago.  Notified Sharen CounterLisa Leftwich-Kirby CNM regarding patient's request.

## 2015-01-11 NOTE — MAU Note (Signed)
Pt presents to MAU, brought in from car, states pain started with sudden onset around 1700. Was diagnosed with an ovarian cyst 6 weeks ago and has an appointment in the clinic tomorrow. No bleeding, but states she has a milky white discharge. Clayton LefortL. Kirby CNM assessed pt on arrival.

## 2015-01-11 NOTE — MAU Provider Note (Signed)
Chief Complaint: Abdominal Pain   None     SUBJECTIVE HPI: Jenna Velez is a 24 y.o. G1P1001 who presents to maternity admissions reporting severe left sided abdominal pain.  She is crying, rocking, and unable to move related to her pain.  She has a known left ovarian cyst and has appointment in [facility] tomorrow to follow up.  She denies vaginal bleeding, vaginal itching/burning, urinary symptoms, h/a, dizziness, n/v, or fever/chills.    Abdominal Pain This is a new problem. The current episode started today. The onset quality is sudden. The problem occurs constantly. The most recent episode lasted 3 hours. The problem has been unchanged. The pain is located in the LLQ and generalized abdominal region. The pain is at a severity of 10/10. The quality of the pain is sharp, tearing and cramping. The abdominal pain does not radiate. Associated symptoms include nausea. The pain is aggravated by certain positions, deep breathing, movement and palpation. The pain is relieved by nothing. Treatments tried: NSAIDs. The treatment provided no relief.    Past Medical History  Diagnosis Date  . GERD (gastroesophageal reflux disease)   . Gastritis   . Depression   . Ovarian cyst    Past Surgical History  Procedure Laterality Date  . No past surgeries     History   Social History  . Marital Status: Single    Spouse Name: N/A  . Number of Children: N/A  . Years of Education: N/A   Occupational History  . Not on file.   Social History Main Topics  . Smoking status: Current Every Day Smoker -- 0.25 packs/day    Types: Cigarettes  . Smokeless tobacco: Never Used  . Alcohol Use: Yes     Comment: occasional  . Drug Use: Yes    Special: Marijuana     Comment: 1 month ago   . Sexual Activity: Yes    Birth Control/ Protection: IUD   Other Topics Concern  . Not on file   Social History Narrative   No current facility-administered medications on file prior to encounter.   Current Outpatient  Prescriptions on File Prior to Encounter  Medication Sig Dispense Refill  . ibuprofen (ADVIL,MOTRIN) 800 MG tablet Take 1 tablet (800 mg total) by mouth 3 (three) times daily. Take on schedule. 30 tablet 0  . oxyCODONE-acetaminophen (PERCOCET/ROXICET) 5-325 MG per tablet Take 1-2 tablets by mouth every 4 (four) hours as needed (For breakthrough pain). 30 tablet 0  . valACYclovir (VALTREX) 1000 MG tablet Take 1 tablet (1,000 mg total) by mouth daily as needed. (Patient taking differently: Take 1,000 mg by mouth daily as needed (outbreaks). ) 20 tablet 0  . levonorgestrel (MIRENA) 20 MCG/24HR IUD 1 each by Intrauterine route once.     Allergies  Allergen Reactions  . Toradol [Ketorolac Tromethamine] Nausea And Vomiting    Able to take ibuprofen    ROS: Pertinent items in HPI  OBJECTIVE Blood pressure 99/65, pulse 86, temperature 98.4 F (36.9 C), temperature source Oral, resp. rate 20. GENERAL: Well-developed, well-nourished female in no acute distress.  HEENT: Normocephalic HEART: normal rate RESP: normal effort ABDOMEN: Soft, tenderness in LLQ, no RLQ tenderness, no rebound, guarding with palpation to LLQ only EXTREMITIES: Nontender, no edema NEURO: Alert and oriented Pelvic exam: Deferred--pt given pain medication prior to ultrasound  LAB RESULTS Results for orders placed or performed during the hospital encounter of 01/11/15 (from the past 24 hour(s))  CBC     Status: None   Collection  Time: 01/11/15  7:11 PM  Result Value Ref Range   WBC 8.7 4.0 - 10.5 K/uL   RBC 4.14 3.87 - 5.11 MIL/uL   Hemoglobin 13.7 12.0 - 15.0 g/dL   HCT 16.139.1 09.636.0 - 04.546.0 %   MCV 94.4 78.0 - 100.0 fL   MCH 33.1 26.0 - 34.0 pg   MCHC 35.0 30.0 - 36.0 g/dL   RDW 40.912.3 81.111.5 - 91.415.5 %   Platelets 214 150 - 400 K/uL  Urinalysis, Routine w reflex microscopic     Status: None   Collection Time: 01/11/15  7:30 PM  Result Value Ref Range   Color, Urine YELLOW YELLOW   APPearance CLEAR CLEAR   Specific  Gravity, Urine 1.020 1.005 - 1.030   pH 5.5 5.0 - 8.0   Glucose, UA NEGATIVE NEGATIVE mg/dL   Hgb urine dipstick NEGATIVE NEGATIVE   Bilirubin Urine NEGATIVE NEGATIVE   Ketones, ur NEGATIVE NEGATIVE mg/dL   Protein, ur NEGATIVE NEGATIVE mg/dL   Urobilinogen, UA 0.2 0.0 - 1.0 mg/dL   Nitrite NEGATIVE NEGATIVE   Leukocytes, UA NEGATIVE NEGATIVE  Urine rapid drug screen (hosp performed)     Status: None   Collection Time: 01/11/15  7:30 PM  Result Value Ref Range   Opiates NONE DETECTED NONE DETECTED   Cocaine NONE DETECTED NONE DETECTED   Benzodiazepines NONE DETECTED NONE DETECTED   Amphetamines NONE DETECTED NONE DETECTED   Tetrahydrocannabinol NONE DETECTED NONE DETECTED   Barbiturates NONE DETECTED NONE DETECTED  Pregnancy, urine POC     Status: None   Collection Time: 01/11/15  7:35 PM  Result Value Ref Range   Preg Test, Ur NEGATIVE NEGATIVE    IMAGING [place] Transvaginal Non-ob  01/11/2015   CLINICAL DATA:  Initial encounter for left lower quadrant pain and vomiting.  EXAM: TRANSVAGINAL ULTRASOUND OF PELVIS  DOPPLER ULTRASOUND OF OVARIES  TECHNIQUE: Transvaginal ultrasound examination of the pelvis was performed including evaluation of the uterus, ovaries, adnexal regions, and pelvic cul-de-sac.  Color and duplex Doppler ultrasound was utilized to evaluate blood flow to the ovaries.  It was necessary to proceed with endovaginal exam following the transabdominal exam to visualize the ovaries. Color and duplex Doppler ultrasound was utilized to evaluate blood flow to the ovaries.  COMPARISON:  11/17/2014.  FINDINGS: Uterus  Measurements: 8.3 x 4.0 x 5.3 cm. No fibroids or other mass visualized.  Endometrium  Thickness: Linear echogenic structure in the endometrial canal is compatible with the presence of an IUD. No focal abnormality visualized.  Right ovary  Measurements: 3.2 x 1.8 x 3.3 cm. Normal appearance/no adnexal mass.  Left ovary  Measurements: 1.9 x 1.1 x 1.5 cm. Normal  appearance/no adnexal mass.  Pulsed Doppler evaluation demonstrates normal low-resistance arterial and venous waveforms in both ovaries.  Small volume intraperitoneal free fluid identified in the cul-de-sac  IMPRESSION: Unremarkable ultrasound exam of the pelvis. Specifically, no evidence for ovarian torsion.   Electronically Signed   By: Kennith CenterEric  Mansell M.D.   On: 01/11/2015 20:43   [place] Art/ven Flow Abd Pelv Doppler  01/11/2015   CLINICAL DATA:  Initial encounter for left lower quadrant pain and vomiting.  EXAM: TRANSVAGINAL ULTRASOUND OF PELVIS  DOPPLER ULTRASOUND OF OVARIES  TECHNIQUE: Transvaginal ultrasound examination of the pelvis was performed including evaluation of the uterus, ovaries, adnexal regions, and pelvic cul-de-sac.  Color and duplex Doppler ultrasound was utilized to evaluate blood flow to the ovaries.  It was necessary to proceed with endovaginal exam following the transabdominal  exam to visualize the ovaries. Color and duplex Doppler ultrasound was utilized to evaluate blood flow to the ovaries.  COMPARISON:  11/17/2014.  FINDINGS: Uterus  Measurements: 8.3 x 4.0 x 5.3 cm. No fibroids or other mass visualized.  Endometrium  Thickness: Linear echogenic structure in the endometrial canal is compatible with the presence of an IUD. No focal abnormality visualized.  Right ovary  Measurements: 3.2 x 1.8 x 3.3 cm. Normal appearance/no adnexal mass.  Left ovary  Measurements: 1.9 x 1.1 x 1.5 cm. Normal appearance/no adnexal mass.  Pulsed Doppler evaluation demonstrates normal low-resistance arterial and venous waveforms in both ovaries.  Small volume intraperitoneal free fluid identified in the cul-de-sac  IMPRESSION: Unremarkable ultrasound exam of the pelvis. Specifically, no evidence for ovarian torsion.   Electronically Signed   By: Kennith Center M.D.   On: 01/11/2015 20:43   MAU Management: Dilaudid 2 mg IM x 1 dose in MAU with improvement in pain before ultrasound.  Pt reports pain returned  after vaginal ultrasound but not crying or rocking with pain.  Pt declined Toradol, reporting n/v reaction with this medication. Ibuprofen 600 mg PO x 1 dose in MAU with some relief reported.  ASSESSMENT 1. Ruptured cyst of ovary   2. LLQ abdominal pain   3. Left ovarian cyst     PLAN Discharge home Percocet 5/325, take 1-2 Q 6 hours PRN x 15 tabs Phenergan 12.5-25 mg PO Q 6 hours PRN Ibuprofen 600 mg PO Q 6 hours   Follow-up Information    Follow up with [facility].   Specialty:  Obstetrics and Gynecology   Why:  As scheduled tomorrow at 12:45   Contact information:   [address] [place] [address] [phone]      Follow up with [facility].   Why:  As needed for emergencies   Contact information:   [address] [id number] mc [place] [address] [phone]      Sharen Counter Certified Nurse-Midwife 01/11/2015  9:02 PM

## 2015-01-12 ENCOUNTER — Encounter: Payer: 59 | Admitting: Obstetrics and Gynecology

## 2015-01-19 ENCOUNTER — Encounter: Payer: Self-pay | Admitting: Obstetrics & Gynecology

## 2015-01-19 ENCOUNTER — Ambulatory Visit (INDEPENDENT_AMBULATORY_CARE_PROVIDER_SITE_OTHER): Payer: 59 | Admitting: Obstetrics & Gynecology

## 2015-01-19 VITALS — BP 111/55 | HR 82 | Ht 64.0 in | Wt 178.2 lb

## 2015-01-19 DIAGNOSIS — N83201 Unspecified ovarian cyst, right side: Secondary | ICD-10-CM

## 2015-01-19 DIAGNOSIS — N83209 Unspecified ovarian cyst, unspecified side: Secondary | ICD-10-CM | POA: Insufficient documentation

## 2015-01-19 DIAGNOSIS — N8320 Unspecified ovarian cysts: Secondary | ICD-10-CM

## 2015-01-19 NOTE — Patient Instructions (Signed)
Pelvic Pain Female pelvic pain can be caused by many different things and start from a variety of places. Pelvic pain refers to pain that is located in the lower half of the abdomen and between your hips. The pain may occur over a short period of time (acute) or may be reoccurring (chronic). The cause of pelvic pain may be related to disorders affecting the female reproductive organs (gynecologic), but it may also be related to the bladder, kidney stones, an intestinal complication, or muscle or skeletal problems. Getting help right away for pelvic pain is important, especially if there has been severe, sharp, or a sudden onset of unusual pain. It is also important to get help right away because some types of pelvic pain can be life threatening.  CAUSES  Below are only some of the causes of pelvic pain. The causes of pelvic pain can be in one of several categories.   Gynecologic.  Pelvic inflammatory disease.  Sexually transmitted infection.  Ovarian cyst or a twisted ovarian ligament (ovarian torsion).  Uterine lining that grows outside the uterus (endometriosis).  Fibroids, cysts, or tumors.  Ovulation.  Pregnancy.  Pregnancy that occurs outside the uterus (ectopic pregnancy).  Miscarriage.  Labor.  Abruption of the placenta or ruptured uterus.  Infection.  Uterine infection (endometritis).  Bladder infection.  Diverticulitis.  Miscarriage related to a uterine infection (septic abortion).  Bladder.  Inflammation of the bladder (cystitis).  Kidney stone(s).  Gastrointestinal.  Constipation.  Diverticulitis.  Neurologic.  Trauma.  Feeling pelvic pain because of mental or emotional causes (psychosomatic).  Cancers of the bowel or pelvis. EVALUATION  Your caregiver will want to take a careful history of your concerns. This includes recent changes in your health, a careful gynecologic history of your periods (menses), and a sexual history. Obtaining your family  history and medical history is also important. Your caregiver may suggest a pelvic exam. A pelvic exam will help identify the location and severity of the pain. It also helps in the evaluation of which organ system may be involved. In order to identify the cause of the pelvic pain and be properly treated, your caregiver may order tests. These tests may include:   A pregnancy test.  Pelvic ultrasonography.  An X-ray exam of the abdomen.  A urinalysis or evaluation of vaginal discharge.  Blood tests. HOME CARE INSTRUCTIONS   Only take over-the-counter or prescription medicines for pain, discomfort, or fever as directed by your caregiver.   Rest as directed by your caregiver.   Eat a balanced diet.   Drink enough fluids to make your urine clear or pale yellow, or as directed.   Avoid sexual intercourse if it causes pain.   Apply warm or cold compresses to the lower abdomen depending on which one helps the pain.   Avoid stressful situations.   Keep a journal of your pelvic pain. Write down when it started, where the pain is located, and if there are things that seem to be associated with the pain, such as food or your menstrual cycle.  Follow up with your caregiver as directed.  SEEK MEDICAL CARE IF:  Your medicine does not help your pain.  You have abnormal vaginal discharge. SEEK IMMEDIATE MEDICAL CARE IF:   You have heavy bleeding from the vagina.   Your pelvic pain increases.   You feel light-headed or faint.   You have chills.   You have pain with urination or blood in your urine.   You have uncontrolled diarrhea   or vomiting.   You have a fever or persistent symptoms for more than 3 days.  You have a fever and your symptoms suddenly get worse.   You are being physically or sexually abused.  MAKE SURE YOU:  Understand these instructions.  Will watch your condition.  Will get help if you are not doing well or get worse. Document Released:  08/20/2004 Document Revised: 02/07/2014 Document Reviewed: 01/13/2012 ExitCare Patient Information 2015 [employer]. This information is not intended to replace advice given to you by your health care provider. Make sure you discuss any questions you have with your health care provider.  

## 2015-01-19 NOTE — Progress Notes (Signed)
Subjective:     Patient ID: Jenna Velez, female   DOB: December 31, 1990, 24 y.o.   MRN: [id number]  HPI Seen in MAU 4/6 with pain which has improved, see note below    SUBJECTIVE HPI: Jenna Velez is a 24 y.o. G1P1001 who presents to maternity admissions reporting severe left sided abdominal pain. She is crying, rocking, and unable to move related to her pain. She has a known left ovarian cyst and has appointment in [facility] tomorrow to follow up. She denies vaginal bleeding, vaginal itching/burning, urinary symptoms, h/a, dizziness, n/v, or fever/chills.   Abdominal Pain This is a new problem. The current episode started today. The onset quality is sudden. The problem occurs constantly. The most recent episode lasted 3 hours. The problem has been unchanged. The pain is located in the LLQ and generalized abdominal region. The pain is at a severity of 10/10. The quality of the pain is sharp, tearing and cramping. The abdominal pain does not radiate. Associated symptoms include nausea. The pain is aggravated by certain positions, deep breathing, movement and palpation. The pain is relieved by nothing. Treatments tried: NSAIDs. The treatment provided no relief.    Past Medical History  Diagnosis Date  . GERD (gastroesophageal reflux disease)   . Gastritis   . Depression   . Ovarian cyst    Past Surgical History  Procedure Laterality Date  . No past surgeries     History   Social History  . Marital Status: Single    Spouse Name: N/A  . Number of Children: N/A  . Years of Education: N/A   Occupational History  . Not on file.   Social History Main Topics  . Smoking status: Current Every Day Smoker -- 0.25 packs/day    Types: Cigarettes  . Smokeless tobacco: Never Used  . Alcohol Use: Yes     Comment: occasional  . Drug Use: Yes    Special: Marijuana     Comment: 1 month ago   . Sexual Activity: Yes     Birth Control/ Protection: IUD   Other Topics Concern  . Not on file   Social History Narrative   No current facility-administered medications on file prior to encounter.   Current Outpatient Prescriptions on File Prior to Encounter  Medication Sig Dispense Refill  . ibuprofen (ADVIL,MOTRIN) 800 MG tablet Take 1 tablet (800 mg total) by mouth 3 (three) times daily. Take on schedule. 30 tablet 0  . oxyCODONE-acetaminophen (PERCOCET/ROXICET) 5-325 MG per tablet Take 1-2 tablets by mouth every 4 (four) hours as needed (For breakthrough pain). 30 tablet 0  . valACYclovir (VALTREX) 1000 MG tablet Take 1 tablet (1,000 mg total) by mouth daily as needed. (Patient taking differently: Take 1,000 mg by mouth daily as needed (outbreaks). ) 20 tablet 0  . levonorgestrel (MIRENA) 20 MCG/24HR IUD 1 each by Intrauterine route once.     Allergies  Allergen Reactions  . Toradol [Ketorolac Tromethamine] Nausea And Vomiting    Able to take ibuprofen    ROS: Pertinent items in HPI  OBJECTIVE Blood pressure 99/65, pulse 86, temperature 98.4 F (36.9 C), temperature source Oral, resp. rate 20. GENERAL: Well-developed, well-nourished female in no acute distress.  HEENT: Normocephalic HEART: normal rate RESP: normal effort ABDOMEN: Soft, tenderness in LLQ, no RLQ tenderness, no rebound, guarding with palpation to LLQ only EXTREMITIES: Nontender, no edema NEURO: Alert and oriented Pelvic exam: Deferred--pt given pain medication prior to ultrasound  LAB RESULTS  Lab Results Last 24  Hours    Results for orders placed or performed during the hospital encounter of 01/11/15 (from the past 24 hour(s))  CBC Status: None   Collection Time: 01/11/15 7:11 PM  Result Value Ref Range   WBC 8.7 4.0 - 10.5 K/uL   RBC 4.14 3.87 - 5.11 MIL/uL   Hemoglobin 13.7 12.0 - 15.0 g/dL   HCT 16.1 09.6 - 04.5 %   MCV 94.4 78.0 - 100.0 fL    MCH 33.1 26.0 - 34.0 pg   MCHC 35.0 30.0 - 36.0 g/dL   RDW 40.9 81.1 - 91.4 %   Platelets 214 150 - 400 K/uL  Urinalysis, Routine w reflex microscopic Status: None   Collection Time: 01/11/15 7:30 PM  Result Value Ref Range   Color, Urine YELLOW YELLOW   APPearance CLEAR CLEAR   Specific Gravity, Urine 1.020 1.005 - 1.030   pH 5.5 5.0 - 8.0   Glucose, UA NEGATIVE NEGATIVE mg/dL   Hgb urine dipstick NEGATIVE NEGATIVE   Bilirubin Urine NEGATIVE NEGATIVE   Ketones, ur NEGATIVE NEGATIVE mg/dL   Protein, ur NEGATIVE NEGATIVE mg/dL   Urobilinogen, UA 0.2 0.0 - 1.0 mg/dL   Nitrite NEGATIVE NEGATIVE   Leukocytes, UA NEGATIVE NEGATIVE  Urine rapid drug screen (hosp performed) Status: None   Collection Time: 01/11/15 7:30 PM  Result Value Ref Range   Opiates NONE DETECTED NONE DETECTED   Cocaine NONE DETECTED NONE DETECTED   Benzodiazepines NONE DETECTED NONE DETECTED   Amphetamines NONE DETECTED NONE DETECTED   Tetrahydrocannabinol NONE DETECTED NONE DETECTED   Barbiturates NONE DETECTED NONE DETECTED  Pregnancy, urine POC Status: None   Collection Time: 01/11/15 7:35 PM  Result Value Ref Range   Preg Test, Ur NEGATIVE NEGATIVE      IMAGING  Imaging Results    US Transvaginal Non-ob  01/11/2015 CLINICAL DATA: Initial encounter for left lower quadrant pain and vomiting. EXAM: TRANSVAGINAL ULTRASOUND OF PELVIS DOPPLER ULTRASOUND OF OVARIES TECHNIQUE: Transvaginal ultrasound examination of the pelvis was performed including evaluation of the uterus, ovaries, adnexal regions, and pelvic cul-de-sac. Color and duplex Doppler ultrasound was utilized to evaluate blood flow to the ovaries. It was necessary to proceed with endovaginal exam following the transabdominal exam to visualize the ovaries. Color and duplex Doppler ultrasound was utilized to evaluate blood  flow to the ovaries. COMPARISON: 11/17/2014. FINDINGS: Uterus Measurements: 8.3 x 4.0 x 5.3 cm. No fibroids or other mass visualized. Endometrium Thickness: Linear echogenic structure in the endometrial canal is compatible with the presence of an IUD. No focal abnormality visualized. Right ovary Measurements: 3.2 x 1.8 x 3.3 cm. Normal appearance/no adnexal mass. Left ovary Measurements: 1.9 x 1.1 x 1.5 cm. Normal appearance/no adnexal mass. Pulsed Doppler evaluation demonstrates normal low-resistance arterial and venous waveforms in both ovaries. Small volume intraperitoneal free fluid identified in the cul-de-sac IMPRESSION: Unremarkable ultrasound exam of the pelvis. Specifically, no evidence for ovarian torsion. Electronically Signed By: Kennith Center M.D. On: 01/11/2015 20:43   [place] Art/ven Flow Abd Pelv Doppler  01/11/2015 CLINICAL DATA: Initial encounter for left lower quadrant pain and vomiting. EXAM: TRANSVAGINAL ULTRASOUND OF PELVIS DOPPLER ULTRASOUND OF OVARIES TECHNIQUE: Transvaginal ultrasound examination of the pelvis was performed including evaluation of the uterus, ovaries, adnexal regions, and pelvic cul-de-sac. Color and duplex Doppler ultrasound was utilized to evaluate blood flow to the ovaries. It was necessary to proceed with endovaginal exam following the transabdominal exam to visualize the ovaries. Color and duplex Doppler ultrasound was utilized to evaluate blood flow  to the ovaries. COMPARISON: 11/17/2014. FINDINGS: Uterus Measurements: 8.3 x 4.0 x 5.3 cm. No fibroids or other mass visualized. Endometrium Thickness: Linear echogenic structure in the endometrial canal is compatible with the presence of an IUD. No focal abnormality visualized. Right ovary Measurements: 3.2 x 1.8 x 3.3 cm. Normal appearance/no adnexal mass. Left ovary Measurements: 1.9 x 1.1 x 1.5 cm. Normal appearance/no adnexal mass. Pulsed Doppler evaluation demonstrates normal  low-resistance arterial and venous waveforms in both ovaries. Small volume intraperitoneal free fluid identified in the cul-de-sac IMPRESSION: Unremarkable ultrasound exam of the pelvis. Specifically, no evidence for ovarian torsion. Electronically Signed By: Kennith CenterEric Mansell M.D. On: 01/11/2015 20:43    MAU Management: Dilaudid 2 mg IM x 1 dose in MAU with improvement in pain before ultrasound. Pt reports pain returned after vaginal ultrasound but not crying or rocking with pain. Pt declined Toradol, reporting n/v reaction with this medication. Ibuprofen 600 mg PO x 1 dose in MAU with some relief reported.  ASSESSMENT 1. Ruptured cyst of ovary   2. LLQ abdominal pain   3. Left ovarian cyst     PLAN Discharge home Percocet 5/325, take 1-2 Q 6 hours PRN x 15 tabs Phenergan 12.5-25 mg PO Q 6 hours PRN Ibuprofen 600 mg PO Q 6 hours        Review of Systems  Respiratory: Negative.   Gastrointestinal: Negative.   Genitourinary: Positive for pelvic pain (cramps, much improved).       Objective:   Physical Exam  Constitutional: She is oriented to person, place, and time. She appears well-developed. No distress.  Abdominal: Soft. She exhibits no distension. There is no tenderness.  Genitourinary:  deferred  Neurological: She is alert and oriented to person, place, and time.  Skin: Skin is warm and dry.  Psychiatric: She has a normal mood and affect. Her behavior is normal.       Assessment:     S/p ovarian cyst rupture     Plan:     If not improved RTC, o/w routine care  Adam PhenixJames G Trinady Milewski, MD 01/19/2015

## 2015-04-17 IMAGING — US US ART/VEN ABD/PELV/SCROTUM DOPPLER LTD
1 series · 13 of 25 positions shown · non-contrast
Comparison: None.

CLINICAL DATA: Left lower quadrant abdominal pain. Questionable
ovarian torsion. History of an ovarian cyst.

EXAM:
TRANSABDOMINAL AND TRANSVAGINAL ULTRASOUND OF PELVIS
DOPPLER ULTRASOUND OF OVARIES
TECHNIQUE: Both transabdominal and transvaginal ultrasound examinations of the
pelvis were performed. Transabdominal technique was performed for
global imaging of the pelvis including uterus, ovaries, adnexal
regions, and pelvic cul-de-sac.
It was necessary to proceed with endovaginal exam following the
transabdominal exam to visualize the the endometrium and ovaries to
better advantage. Color and duplex Doppler ultrasound was utilized
to evaluate blood flow to the ovaries.

[Series 1: us pelvis complete · 13 of 55 slices shown]
[im 1/55]
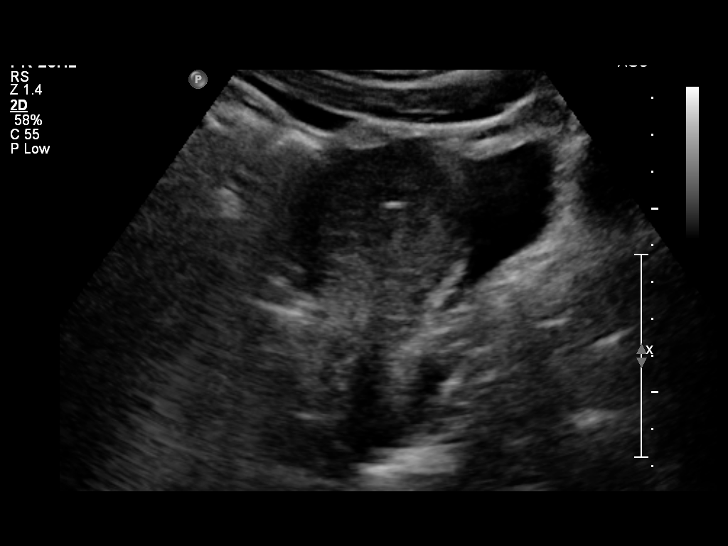
[im 5/55]
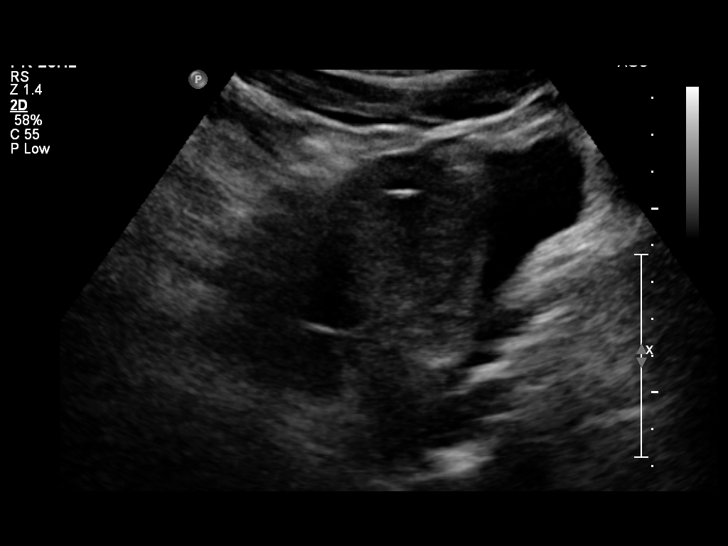
[im 10/55]
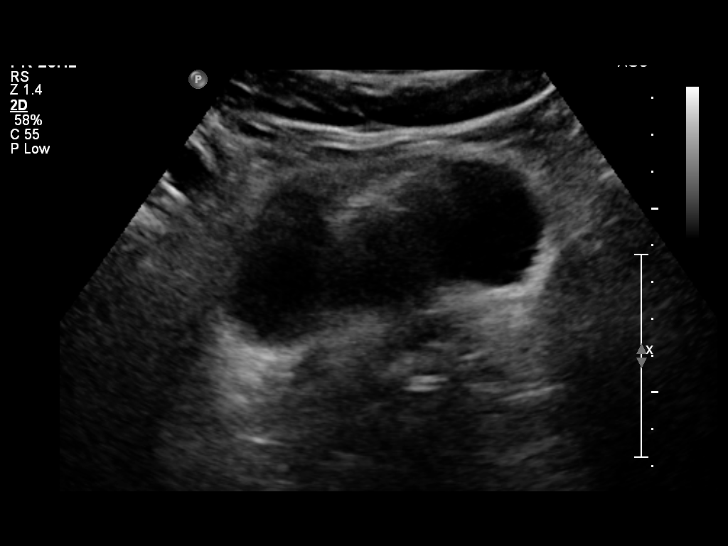
[im 14/55]
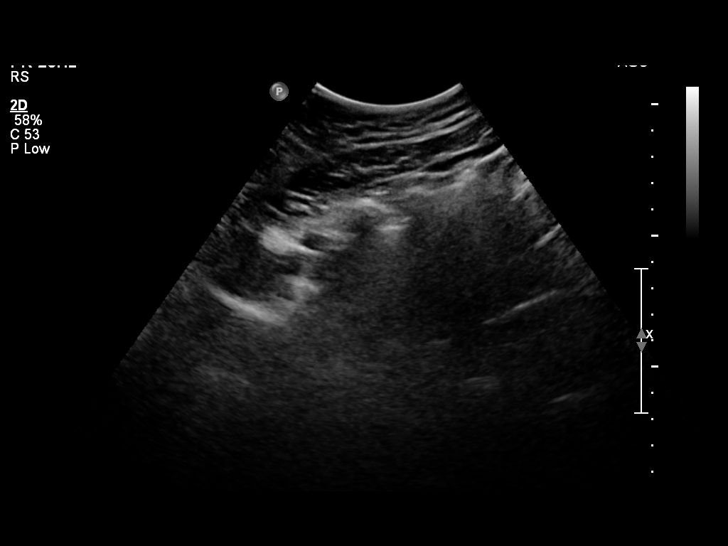
[im 19/55]
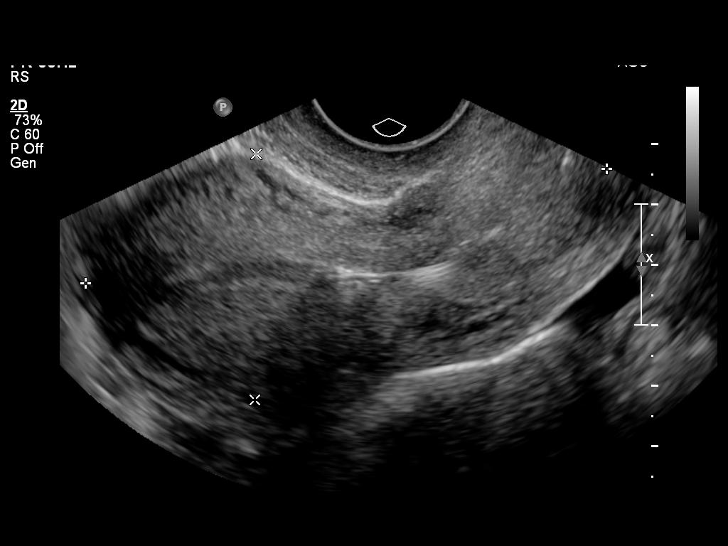
[im 23/55]
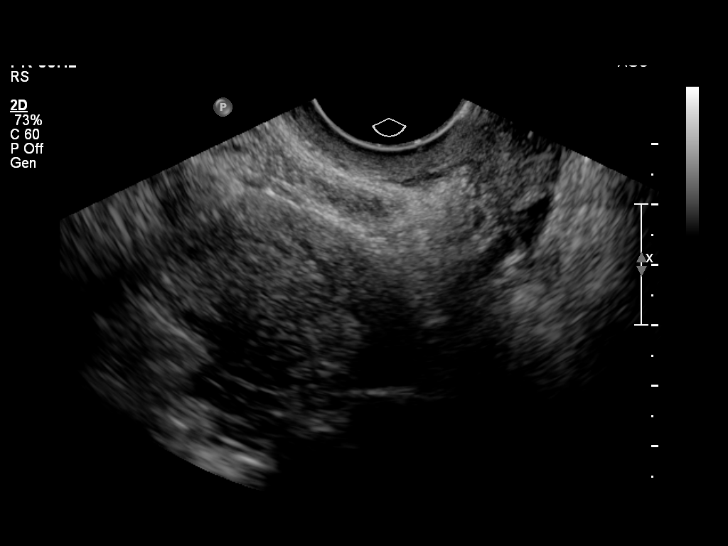
[im 28/55]
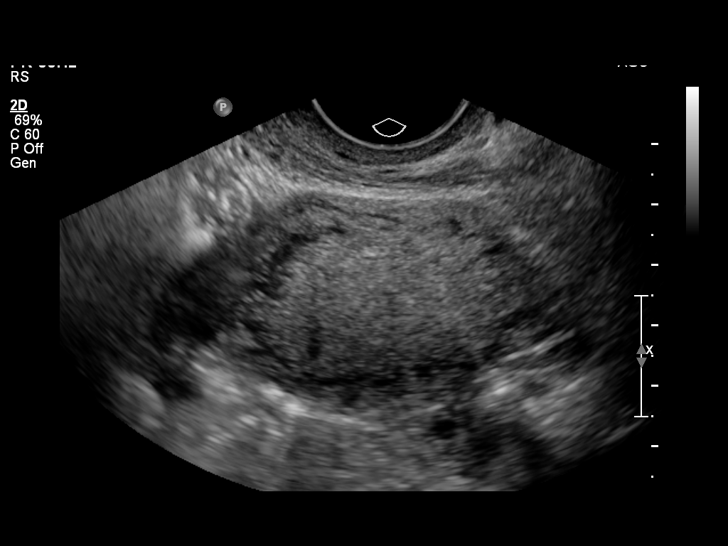
[im 32/55]
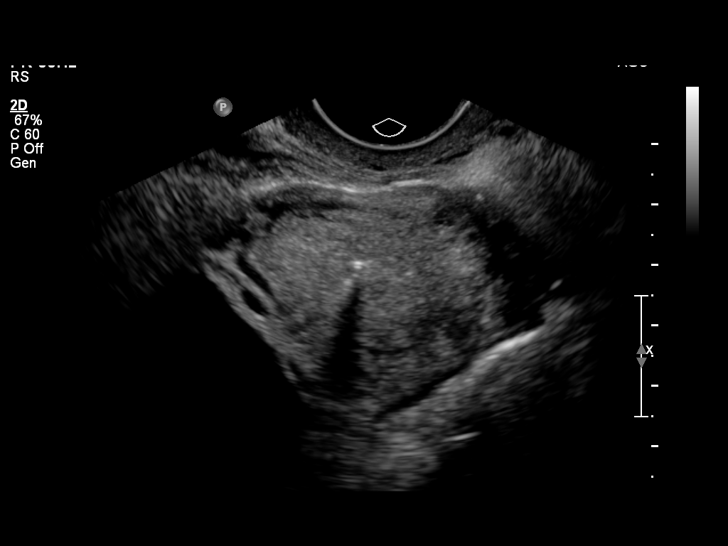
[im 37/55]
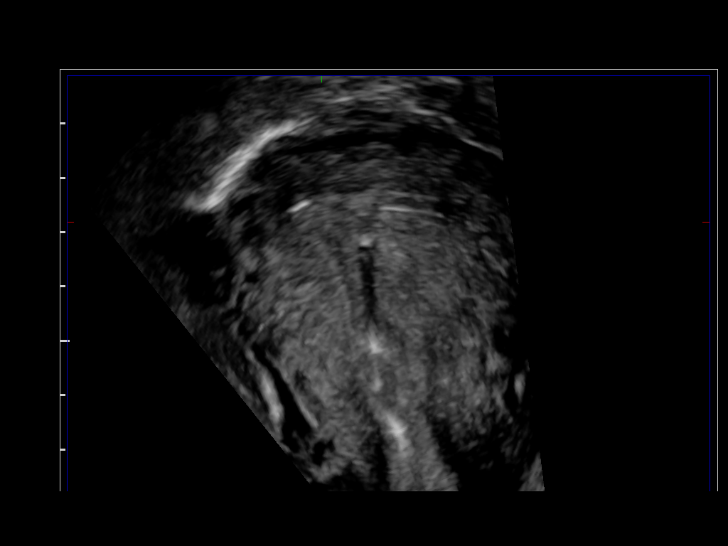
[im 41/55]
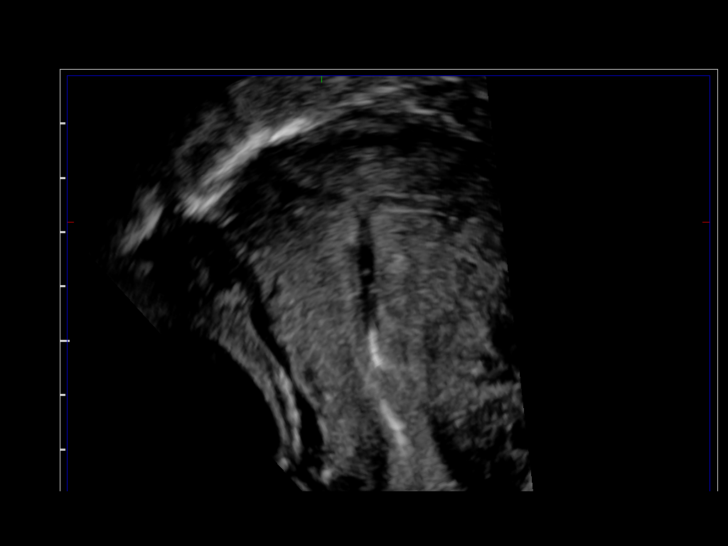
[im 46/55]
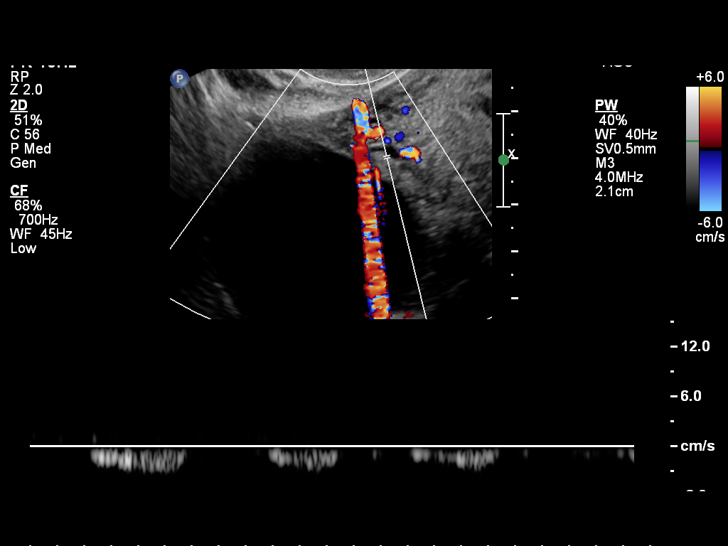
[im 50/55]
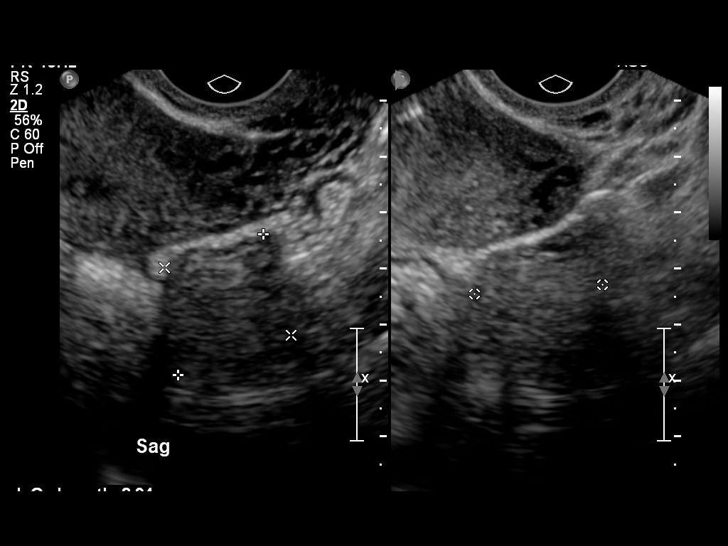
[im 55/55]
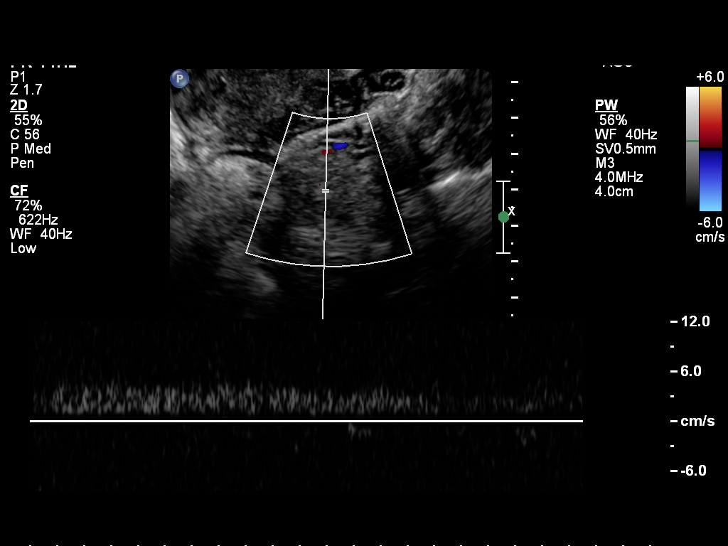

[13 of 25 positions shown; findings below may reference images not displayed]

FINDINGS: Uterus

Measurements: 8.8 cm x 4.1 cm x 5.2 cm. No fibroids or other mass
visualized.

Endometrium

Thickness: 6.1 mm. Mirena IUD well-positioned. No endometrial mass
or fluid.

Right ovary

Measurements: 5.8 cm x 4.5 cm x 5.1 cm. Ovary enlarged by a simple
appearing cyst measuring 5.4 cm x 4.7 cm x 4.6 cm. No other ovarian
abnormality. No adnexal mass.

Left ovary

Measurements: 2.9 cm x 2.6 cm x 2.3 cm. Normal appearance/no adnexal
mass.

Pulsed Doppler evaluation of both ovaries demonstrates normal
low-resistance arterial and venous waveforms.

Other findings

No free fluid.
IMPRESSION: 1. No evidence of ovarian torsion.  No acute findings.
2. 5.4 cm right ovarian simple appearing cyst. This is almost
certainly benign, but follow up ultrasound is recommended in 1 year
according to the Society of Radiologists in PltrasoundCD1D Consensus
Conference Statement (Reese Tiger et al. Management of Asymptomatic
Ovarian and Other Adnexal Cysts Imaged at US: Society of
Radiologists in Ultrasound Consensus Conference Statement 9545.
Radiology [DATE]): [id number].).
3. No other abnormalities.  IUD is well positioned.

## 2015-06-11 IMAGING — US US TRANSVAGINAL NON-OB
1 series · 13 of 25 positions shown · non-contrast
Comparison: 11/17/2014.

CLINICAL DATA: Initial encounter for left lower quadrant pain and
vomiting.

EXAM:
TRANSVAGINAL ULTRASOUND OF PELVIS
DOPPLER ULTRASOUND OF OVARIES
TECHNIQUE: Transvaginal ultrasound examination of the pelvis was performed
including evaluation of the uterus, ovaries, adnexal regions, and
pelvic cul-de-sac.
Color and duplex Doppler ultrasound was utilized to evaluate blood
flow to the ovaries.
It was necessary to proceed with endovaginal exam following the
transabdominal exam to visualize the ovaries. Color and duplex
Doppler ultrasound was utilized to evaluate blood flow to the
ovaries.

[Series 1: us transvaginal non-ob · 13 of 36 slices shown]
[im 1/36]
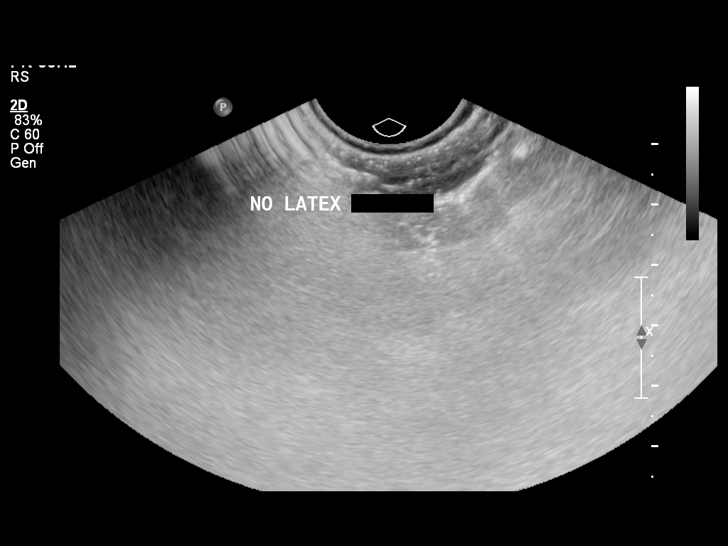
[im 3/36]
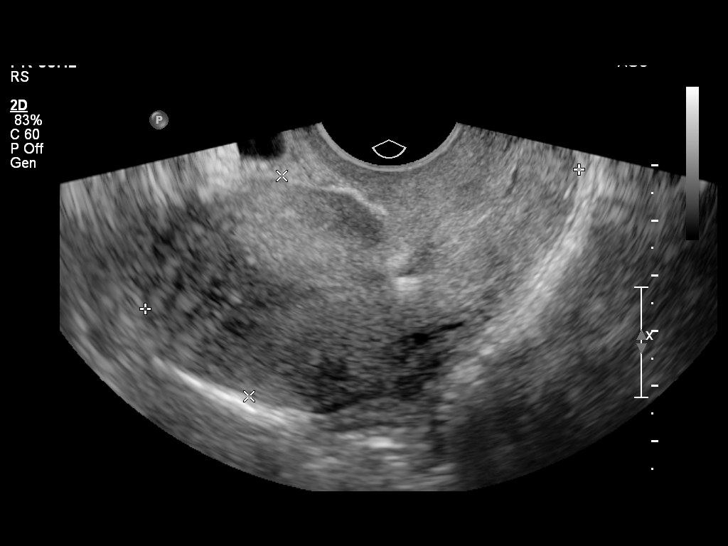
[im 6/36]
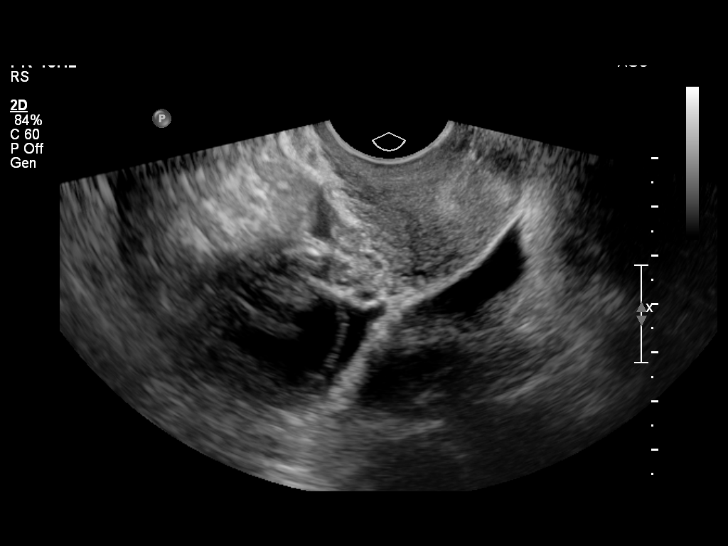
[im 9/36]
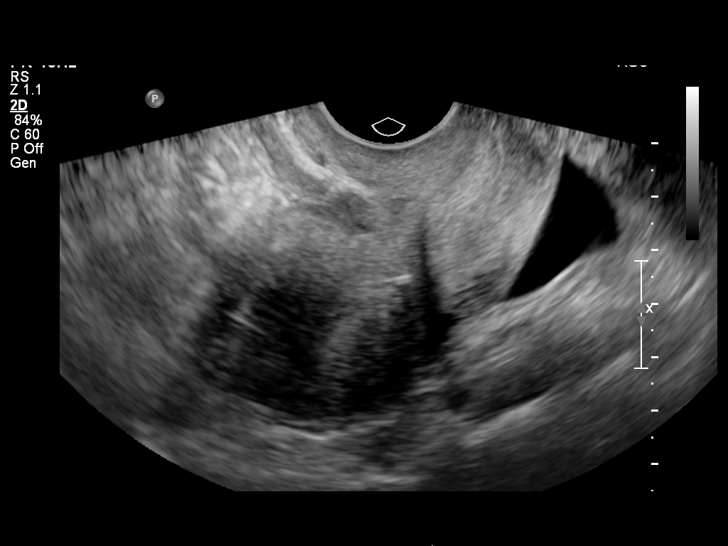
[im 12/36]
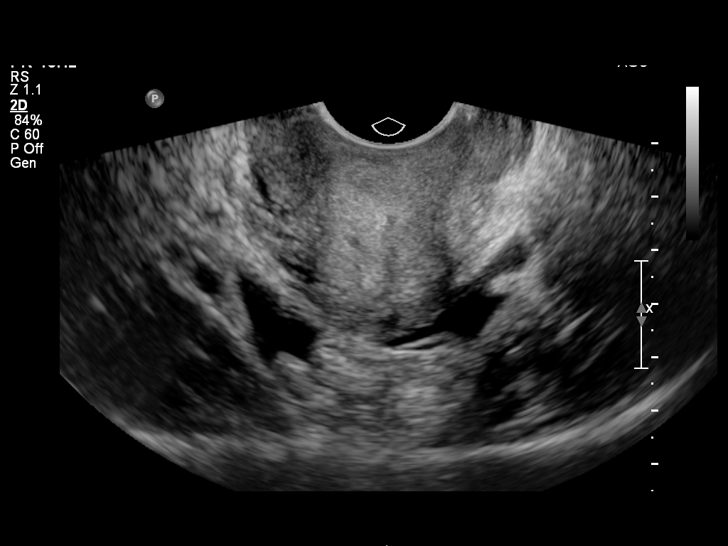
[im 15/36]
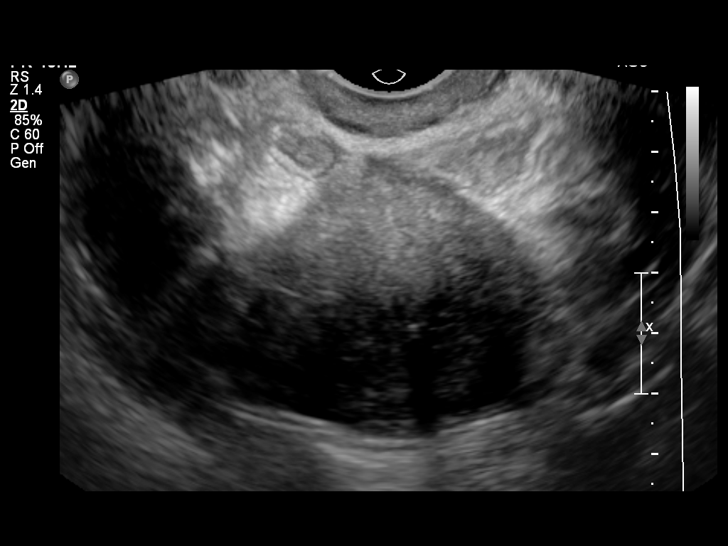
[im 18/36]
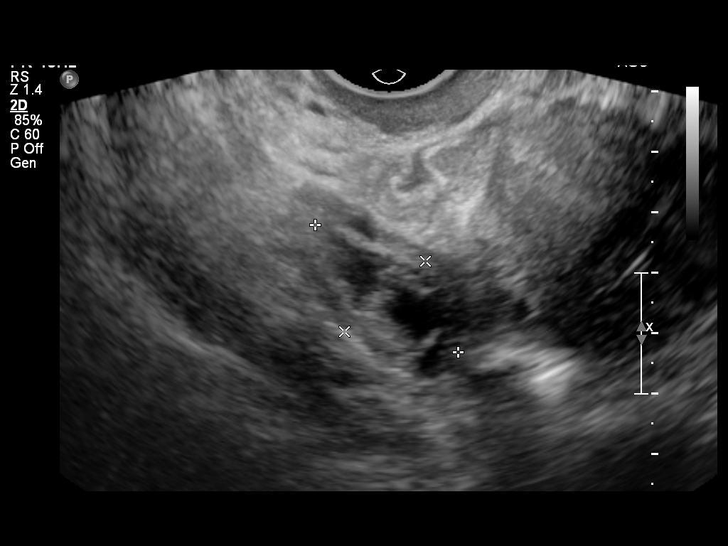
[im 21/36]
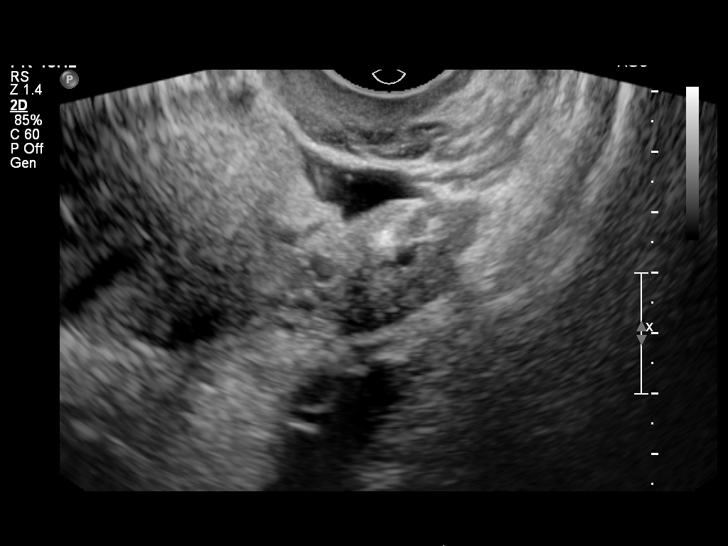
[im 24/36]
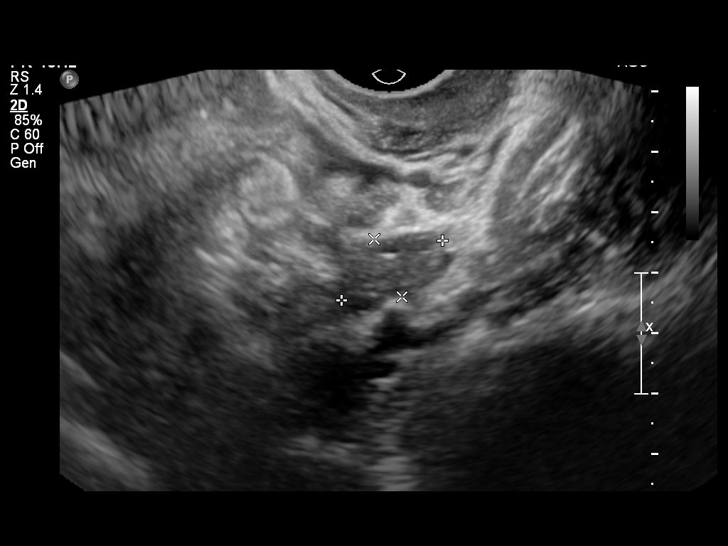
[im 27/36]
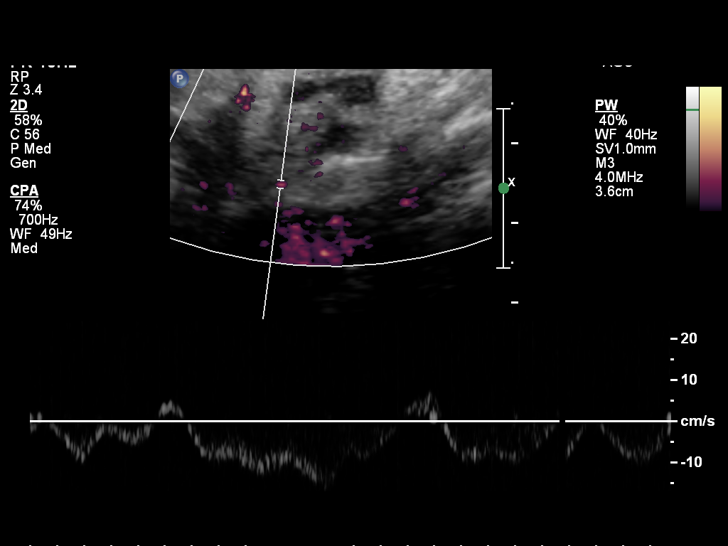
[im 30/36]
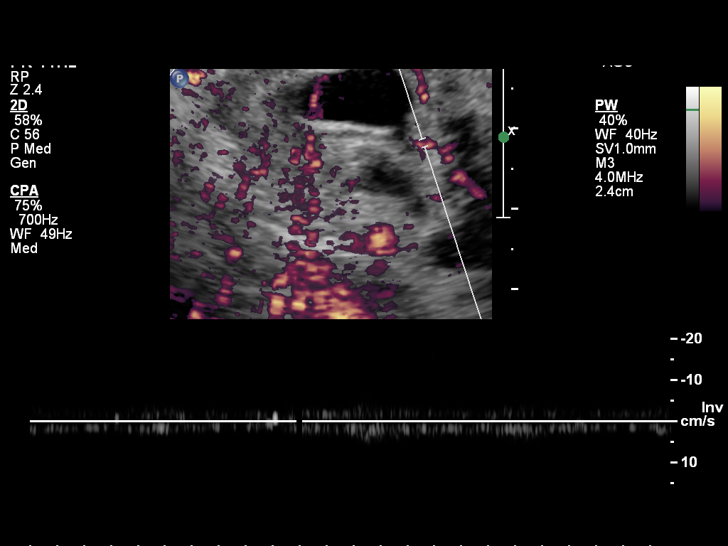
[im 33/36]
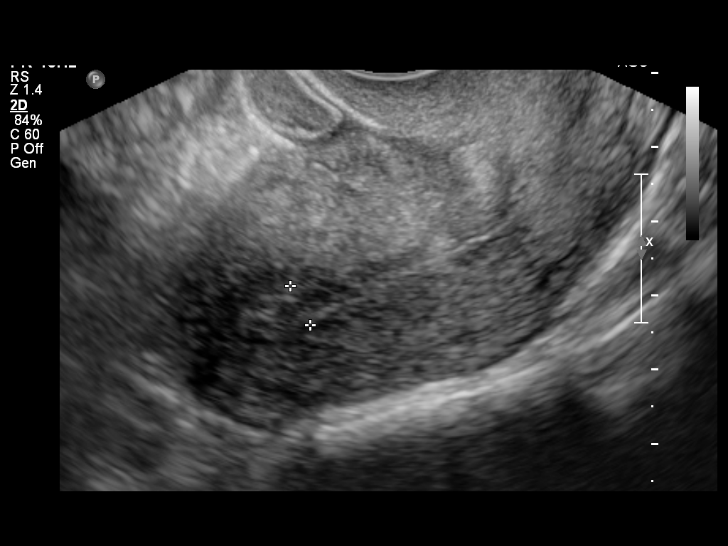
[im 36/36]
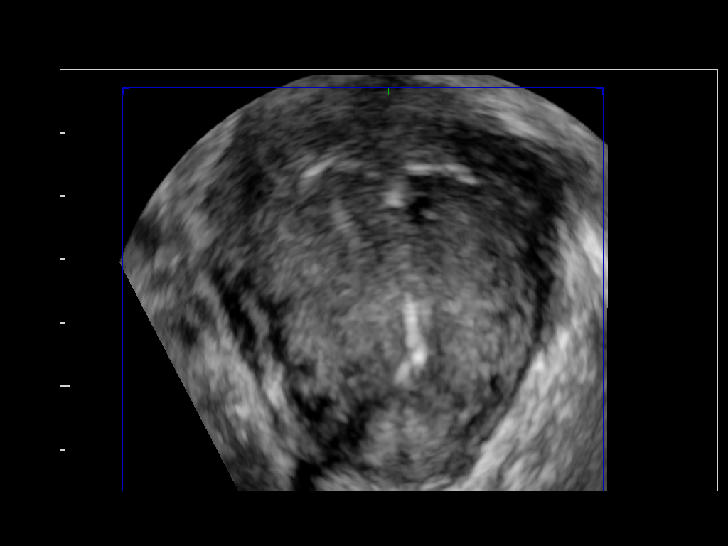

[13 of 25 positions shown; findings below may reference images not displayed]

FINDINGS: Uterus

Measurements: 8.3 x 4.0 x 5.3 cm. No fibroids or other mass
visualized.

Endometrium

Thickness: Linear echogenic structure in the endometrial canal is
compatible with the presence of an IUD. No focal abnormality
visualized.

Right ovary

Measurements: 3.2 x 1.8 x 3.3 cm. Normal appearance/no adnexal mass.

Left ovary

Measurements: 1.9 x 1.1 x 1.5 cm. Normal appearance/no adnexal mass.

Pulsed Doppler evaluation demonstrates normal low-resistance
arterial and venous waveforms in both ovaries.

Small volume intraperitoneal free fluid identified in the cul-de-sac
IMPRESSION: Unremarkable ultrasound exam of the pelvis. Specifically, no
evidence for ovarian torsion.

## 2015-10-22 DIAGNOSIS — Z888 Allergy status to other drugs, medicaments and biological substances status: Secondary | ICD-10-CM | POA: Diagnosis not present

## 2015-10-22 DIAGNOSIS — Z975 Presence of (intrauterine) contraceptive device: Secondary | ICD-10-CM | POA: Diagnosis not present

## 2015-10-22 DIAGNOSIS — R05 Cough: Secondary | ICD-10-CM | POA: Diagnosis not present

## 2015-10-22 DIAGNOSIS — Z886 Allergy status to analgesic agent status: Secondary | ICD-10-CM | POA: Diagnosis not present

## 2015-10-22 DIAGNOSIS — Z881 Allergy status to other antibiotic agents status: Secondary | ICD-10-CM | POA: Diagnosis not present

## 2015-10-22 DIAGNOSIS — F17210 Nicotine dependence, cigarettes, uncomplicated: Secondary | ICD-10-CM | POA: Diagnosis not present

## 2015-10-22 DIAGNOSIS — J40 Bronchitis, not specified as acute or chronic: Secondary | ICD-10-CM | POA: Diagnosis not present

## 2015-10-22 DIAGNOSIS — K219 Gastro-esophageal reflux disease without esophagitis: Secondary | ICD-10-CM | POA: Diagnosis not present

## 2015-10-22 DIAGNOSIS — B009 Herpesviral infection, unspecified: Secondary | ICD-10-CM | POA: Diagnosis not present

## 2015-10-22 DIAGNOSIS — Z720 Tobacco use: Secondary | ICD-10-CM | POA: Diagnosis not present

## 2015-11-09 DIAGNOSIS — J189 Pneumonia, unspecified organism: Secondary | ICD-10-CM | POA: Diagnosis not present

## 2015-11-09 DIAGNOSIS — J168 Pneumonia due to other specified infectious organisms: Secondary | ICD-10-CM | POA: Diagnosis not present

## 2015-11-09 DIAGNOSIS — R0602 Shortness of breath: Secondary | ICD-10-CM | POA: Diagnosis not present

## 2015-11-09 DIAGNOSIS — F17210 Nicotine dependence, cigarettes, uncomplicated: Secondary | ICD-10-CM | POA: Diagnosis not present

## 2015-11-09 DIAGNOSIS — F17200 Nicotine dependence, unspecified, uncomplicated: Secondary | ICD-10-CM | POA: Diagnosis not present

## 2015-12-29 DIAGNOSIS — S8391XA Sprain of unspecified site of right knee, initial encounter: Secondary | ICD-10-CM | POA: Diagnosis not present

## 2015-12-29 DIAGNOSIS — F17210 Nicotine dependence, cigarettes, uncomplicated: Secondary | ICD-10-CM | POA: Diagnosis not present

## 2015-12-29 DIAGNOSIS — S8991XA Unspecified injury of right lower leg, initial encounter: Secondary | ICD-10-CM | POA: Diagnosis not present

## 2015-12-29 DIAGNOSIS — M25561 Pain in right knee: Secondary | ICD-10-CM | POA: Diagnosis not present

## 2016-02-05 DIAGNOSIS — Z886 Allergy status to analgesic agent status: Secondary | ICD-10-CM | POA: Diagnosis not present

## 2016-02-05 DIAGNOSIS — Z881 Allergy status to other antibiotic agents status: Secondary | ICD-10-CM | POA: Diagnosis not present

## 2016-02-05 DIAGNOSIS — F419 Anxiety disorder, unspecified: Secondary | ICD-10-CM | POA: Diagnosis not present

## 2016-02-05 DIAGNOSIS — R0602 Shortness of breath: Secondary | ICD-10-CM | POA: Diagnosis not present

## 2016-02-05 DIAGNOSIS — F17210 Nicotine dependence, cigarettes, uncomplicated: Secondary | ICD-10-CM | POA: Diagnosis not present

## 2016-02-05 DIAGNOSIS — K219 Gastro-esophageal reflux disease without esophagitis: Secondary | ICD-10-CM | POA: Diagnosis not present

## 2016-02-05 DIAGNOSIS — B009 Herpesviral infection, unspecified: Secondary | ICD-10-CM | POA: Diagnosis not present

## 2016-02-05 DIAGNOSIS — Z791 Long term (current) use of non-steroidal anti-inflammatories (NSAID): Secondary | ICD-10-CM | POA: Diagnosis not present

## 2016-02-05 DIAGNOSIS — Z888 Allergy status to other drugs, medicaments and biological substances status: Secondary | ICD-10-CM | POA: Diagnosis not present

## 2016-02-05 DIAGNOSIS — F410 Panic disorder [episodic paroxysmal anxiety] without agoraphobia: Secondary | ICD-10-CM | POA: Diagnosis not present

## 2016-02-05 DIAGNOSIS — Z975 Presence of (intrauterine) contraceptive device: Secondary | ICD-10-CM | POA: Diagnosis not present

## 2016-02-05 DIAGNOSIS — R064 Hyperventilation: Secondary | ICD-10-CM | POA: Diagnosis not present

## 2016-03-05 DIAGNOSIS — F419 Anxiety disorder, unspecified: Secondary | ICD-10-CM | POA: Diagnosis not present

## 2016-03-05 DIAGNOSIS — A6000 Herpesviral infection of urogenital system, unspecified: Secondary | ICD-10-CM | POA: Diagnosis not present

## 2016-03-05 DIAGNOSIS — Z7689 Persons encountering health services in other specified circumstances: Secondary | ICD-10-CM | POA: Diagnosis not present

## 2016-03-29 ENCOUNTER — Encounter (HOSPITAL_BASED_OUTPATIENT_CLINIC_OR_DEPARTMENT_OTHER): Payer: Self-pay | Admitting: *Deleted

## 2016-03-29 ENCOUNTER — Emergency Department (HOSPITAL_BASED_OUTPATIENT_CLINIC_OR_DEPARTMENT_OTHER)
Admission: EM | Admit: 2016-03-29 | Discharge: 2016-03-29 | Disposition: A | Discharge status: Home / Self Care | Payer: 59 | Attending: Emergency Medicine | Admitting: Emergency Medicine

## 2016-03-29 DIAGNOSIS — F329 Major depressive disorder, single episode, unspecified: Secondary | ICD-10-CM | POA: Insufficient documentation

## 2016-03-29 DIAGNOSIS — N764 Abscess of vulva: Secondary | ICD-10-CM | POA: Diagnosis not present

## 2016-03-29 DIAGNOSIS — L0291 Cutaneous abscess, unspecified: Secondary | ICD-10-CM

## 2016-03-29 DIAGNOSIS — F17210 Nicotine dependence, cigarettes, uncomplicated: Secondary | ICD-10-CM | POA: Diagnosis not present

## 2016-03-29 HISTORY — DX: Anxiety disorder, unspecified: F41.9

## 2016-03-29 MED ORDER — HYDROMORPHONE HCL 1 MG/ML IJ SOLN
1.0000 mg | Freq: Once | INTRAMUSCULAR | Status: AC
Start: 2016-03-29 — End: 2016-03-29
  Administered 2016-03-29: 1 mg via INTRAMUSCULAR
  Filled 2016-03-29: qty 1

## 2016-03-29 MED ORDER — LIDOCAINE HCL (PF) 1 % IJ SOLN
5.0000 mL | Freq: Once | INTRAMUSCULAR | Status: AC
  Administered 2016-03-29: 5 mL
  Filled 2016-03-29: qty 5

## 2016-03-29 MED ORDER — LORAZEPAM 2 MG/ML IJ SOLN
1.0000 mg | Freq: Once | INTRAMUSCULAR | Status: AC
  Administered 2016-03-29: 1 mg via INTRAMUSCULAR
  Filled 2016-03-29: qty 1

## 2016-03-29 MED ORDER — TRAMADOL HCL 50 MG PO TABS: 50.0000 mg | ORAL_TABLET | Freq: Four times a day (QID) | ORAL | Status: DC | PRN

## 2016-03-29 MED ORDER — LORAZEPAM 1 MG PO TABS: 1.0000 mg | ORAL_TABLET | Freq: Once | ORAL | Status: DC

## 2016-03-29 NOTE — ED Notes (Signed)
Pt c/o abscess to right labia x 1 month

## 2016-03-29 NOTE — ED Notes (Addendum)
dsd placed per pt request, pt given mesh panties and peri pad.

## 2016-03-29 NOTE — ED Provider Notes (Signed)
CSN: [id number]     Arrival date & time 03/29/16  1336 History   First MD Initiated Contact with Patient 03/29/16 1357     Chief Complaint  Patient presents with  . Abscess     (Consider location/radiation/quality/duration/timing/severity/associated sxs/prior Treatment) HPI Comments: Patient presents with an abscess to the right labia. She states she's had it for several months and then it in fact was an ingrown hair which she isn't removed at one point. She states the swelling had gone down but over the last 2-3 months it's progressively gotten worse. It's now more painful. It's painful when she sits down or has intercourse. She denies any drainage. No abdominal pain. No fevers or chills. No nausea or vomiting. She is not currently having periods as she has an IUD in place.  Patient is a 25 y.o. female presenting with abscess.  Abscess Associated symptoms: no fatigue, no fever, no headaches, no nausea and no vomiting     Past Medical History  Diagnosis Date  . GERD (gastroesophageal reflux disease)   . Gastritis   . Depression   . Ovarian cyst   . Anxiety    Past Surgical History  Procedure Laterality Date  . No past surgeries     History reviewed. No pertinent family history. Social History  Substance Use Topics  . Smoking status: Current Every Day Smoker -- 0.00 packs/day    Types: Cigarettes  . Smokeless tobacco: Never Used  . Alcohol Use: Yes     Comment: occasional   OB History    Gravida Para Term Preterm AB TAB SAB Ectopic Multiple Living   1 1 1       1      Review of Systems  Constitutional: Negative for fever, chills, diaphoresis and fatigue.  HENT: Negative for congestion, rhinorrhea and sneezing.   Eyes: Negative.   Respiratory: Negative for cough, chest tightness and shortness of breath.   Cardiovascular: Negative for chest pain and leg swelling.  Gastrointestinal: Negative for nausea, vomiting, abdominal pain, diarrhea and blood in stool.   Genitourinary: Negative for frequency, hematuria, flank pain, vaginal bleeding, vaginal discharge and difficulty urinating.  Musculoskeletal: Negative for back pain and arthralgias.  Skin: Positive for wound. Negative for rash.  Neurological: Negative for dizziness, speech difficulty, weakness, numbness and headaches.      Allergies  Toradol  Home Medications   Prior to Admission medications   Medication Sig Start Date End Date Taking? Authorizing Provider  bismuth subsalicylate (PEPTO BISMOL) 262 MG/15ML suspension Take 30 mLs by mouth every 6 (six) hours as needed for indigestion or diarrhea or loose stools.    Historical Provider, MD  ibuprofen (ADVIL,MOTRIN) 600 MG tablet Take 1 tablet (600 mg total) by mouth every 6 (six) hours as needed. 01/11/15   Lisa A Leftwich-Kirby, CNM  ibuprofen (ADVIL,MOTRIN) 800 MG tablet Take 1 tablet (800 mg total) by mouth 3 (three) times daily. Take on schedule. 11/17/14   Dorathy KinsmanVirginia Smith, CNM  levonorgestrel (MIRENA) 20 MCG/24HR IUD 1 each by Intrauterine route once.    Historical Provider, MD  Melatonin 3 MG CAPS Take 1 capsule by mouth at bedtime.    Historical Provider, MD  oxyCODONE-acetaminophen (PERCOCET/ROXICET) 5-325 MG per tablet Take 1-2 tablets by mouth every 6 (six) hours as needed. 01/11/15   Misty StanleyLisa A Leftwich-Kirby, CNM  promethazine (PHENERGAN) 25 MG tablet Take 0.5 tablets (12.5 mg total) by mouth every 6 (six) hours as needed for nausea or vomiting. 01/11/15   Hurshel PartyLisa A Leftwich-Kirby,  CNM  traMADol (ULTRAM) 50 MG tablet Take 1 tablet (50 mg total) by mouth every 6 (six) hours as needed. 03/29/16   Rolan BuccoMelanie Rilyn Upshaw, MD  valACYclovir (VALTREX) 1000 MG tablet Take 1 tablet (1,000 mg total) by mouth daily as needed. Patient not taking: Reported on 01/19/2015 11/17/14   Dorathy KinsmanVirginia Smith, CNM   BP 129/77 mmHg  Pulse 77  Temp(Src) 98.4 F (36.9 C)  Resp 16  Ht 5\' 4"  (1.626 m)  Wt 156 lb (70.761 kg)  BMI 26.76 kg/m2  SpO2 100% Physical Exam   Constitutional: She is oriented to person, place, and time. She appears well-developed and well-nourished.  HENT:  Head: Normocephalic and atraumatic.  Eyes: Pupils are equal, round, and reactive to light.  Neck: Normal range of motion. Neck supple.  Cardiovascular: Normal rate, regular rhythm and normal heart sounds.   Pulmonary/Chest: Effort normal and breath sounds normal. No respiratory distress. She has no wheezes. She has no rales. She exhibits no tenderness.  Abdominal: Soft. Bowel sounds are normal. There is no tenderness. There is no rebound and no guarding.  Genitourinary:  2 cm indurated abscess to right external labia.  No surrounding erythema. No drainage.  Musculoskeletal: Normal range of motion. She exhibits no edema.  Lymphadenopathy:    She has no cervical adenopathy.  Neurological: She is alert and oriented to person, place, and time.  Skin: Skin is warm and dry. No rash noted.  Psychiatric: She has a normal mood and affect.    ED Course  .Marland Kitchen.Incision and Drainage Date/Time: 03/29/2016 3:03 PM Performed by: Ursala Cressy Authorized by: Rolan BuccoBELFI, Roshard Rezabek Consent: Verbal consent obtained. Risks and benefits: risks, benefits and alternatives were discussed Type: abscess Body area: anogenital (external right labia) Anesthesia: local infiltration Local anesthetic: lidocaine 1% without epinephrine Anesthetic total: 3 ml Patient sedated: no Scalpel size: 11 Incision type: elliptical Incision depth: dermal Complexity: simple Drainage: bloody Drainage amount: scant Wound treatment: wound left open Patient tolerance: Patient tolerated the procedure well with no immediate complications   (including critical care time) Labs Review Labs Reviewed - No data to display  Imaging Review No results found. I have personally reviewed and evaluated these images and lab results as part of my medical decision-making.   EKG Interpretation None      MDM   Final diagnoses:   Abscess    Patient presents with an abscess to her external labia. I did perform an incision and drainage but only got scant discharge. There is no surrounding cellulitis I don't feel that antibiotics are indicated. Wound was left open and encouraged the patient to do warm compresses. She was given a prescription for tramadol for pain. She was encouraged to follow-up with the [facility] outpatient center if her symptoms are not improving or return here as needed for any worsening symptoms.    Rolan BuccoMelanie Alfhild Partch, MD 03/29/16 [phone]

## 2016-03-29 NOTE — Discharge Instructions (Signed)

## 2016-03-29 NOTE — ED Notes (Signed)
Pt updated on plan of care. Pt states she is feeling better, and requests that dr. Fredderick Phenixbelfi come in and perform I&D asap. "I think if we wait much longer, I might run out of here." dr. Fredderick PhenixBelfi alerted.

## 2016-05-14 ENCOUNTER — Emergency Department (HOSPITAL_BASED_OUTPATIENT_CLINIC_OR_DEPARTMENT_OTHER)
Admission: EM | Admit: 2016-05-14 | Discharge: 2016-05-14 | Disposition: A | Discharge status: Home / Self Care | Payer: 59 | Attending: Emergency Medicine | Admitting: Emergency Medicine

## 2016-05-14 ENCOUNTER — Encounter (HOSPITAL_BASED_OUTPATIENT_CLINIC_OR_DEPARTMENT_OTHER): Payer: Self-pay | Admitting: Emergency Medicine

## 2016-05-14 DIAGNOSIS — F4323 Adjustment disorder with mixed anxiety and depressed mood: Secondary | ICD-10-CM | POA: Diagnosis not present

## 2016-05-14 DIAGNOSIS — F17210 Nicotine dependence, cigarettes, uncomplicated: Secondary | ICD-10-CM | POA: Insufficient documentation

## 2016-05-14 DIAGNOSIS — F430 Acute stress reaction: Secondary | ICD-10-CM | POA: Diagnosis not present

## 2016-05-14 DIAGNOSIS — F419 Anxiety disorder, unspecified: Secondary | ICD-10-CM | POA: Diagnosis present

## 2016-05-14 DIAGNOSIS — F410 Panic disorder [episodic paroxysmal anxiety] without agoraphobia: Secondary | ICD-10-CM | POA: Diagnosis not present

## 2016-05-14 HISTORY — DX: Panic disorder (episodic paroxysmal anxiety): F41.0

## 2016-05-14 MED ORDER — IBUPROFEN 800 MG PO TABS
800.0000 mg | ORAL_TABLET | Freq: Once | ORAL | Status: AC
  Administered 2016-05-14: 800 mg via ORAL
  Filled 2016-05-14: qty 1

## 2016-05-14 MED ORDER — LORAZEPAM 2 MG/ML IJ SOLN
1.0000 mg | Freq: Once | INTRAMUSCULAR | Status: AC
  Administered 2016-05-14: 1 mg via INTRAMUSCULAR
  Filled 2016-05-14: qty 1

## 2016-05-14 MED ORDER — HYDROXYZINE HCL 25 MG PO TABS: 25.0000 mg | ORAL_TABLET | Freq: Four times a day (QID) | ORAL | 0 refills | Status: DC | PRN

## 2016-05-14 MED ORDER — DIAZEPAM 5 MG PO TABS: 2.5000 mg | ORAL_TABLET | Freq: Four times a day (QID) | ORAL | 0 refills | Status: DC | PRN

## 2016-05-14 MED FILL — diazePAM 5 MG TABS: 5 | 2 days supply | Qty: 8 | Fill #0

## 2016-05-14 MED FILL — hydrOXYzine HCL 25 MG TABS: 25 | 3 days supply | Qty: 12 | Fill #0

## 2016-05-14 NOTE — ED Notes (Signed)
Patient given gingerale and crackers. Family remains at bedside.

## 2016-05-14 NOTE — ED Notes (Signed)
Patient reports feeling nauseated. Patient has been requested to notify staff when her nausea has resolved and she is ready to take her ibuprofen.

## 2016-05-14 NOTE — ED Triage Notes (Signed)
Pt states she is having an anxiety attack for two days.  Pt unable to keep it under control.  Pt has severe headache.  Pt crying in triage, states she has hit rock bottom.  Pt denies SI.

## 2016-05-14 NOTE — ED Provider Notes (Signed)
[facility] Provider Note   CSN: [id number] Arrival date & time: 05/14/16  1410  First Provider Contact:  None       History   Chief Complaint Chief Complaint  Patient presents with  . Panic Attack    HPI Jenna Velez is a [age] y.o. female presents for anxiety and grief reaction. She is attended by her child, grandparents and mother. Patient's states in between sobs that she lost her fiance her job and her house this week and she is just extremely overwhelmed. She states she has been crying for 48 hours straight. She has been unable to hold down foods or fluids because she is too upset and vomits. She tried to take ibuprofen for her headache, but threw it up. She denies abdominal pain or nausea. She has been hyperventilating for the past 2 days. She has a history of similar panic attacks. She denies suicidal ideation, homicidal ideation or audiovisual hallucinations.  HPI  Past Medical History:  Diagnosis Date  . Anxiety   . Depression   . Gastritis   . GERD (gastroesophageal reflux disease)   . Ovarian cyst   . Panic attack     Patient Active Problem List   Diagnosis Date Noted  . Ovarian cyst 01/19/2015    Past Surgical History:  Procedure Laterality Date  . NO PAST SURGERIES      OB History    Gravida Para Term Preterm AB Living   1 1 1     1    SAB TAB Ectopic Multiple Live Births           1       Home Medications    Prior to Admission medications   Medication Sig Start Date End Date Taking? Authorizing Provider  bismuth subsalicylate (PEPTO BISMOL) 262 MG/15ML suspension Take 30 mLs by mouth every 6 (six) hours as needed for indigestion or diarrhea or loose stools.    Historical Provider, MD  ibuprofen (ADVIL,MOTRIN) 600 MG tablet Take 1 tablet (600 mg total) by mouth every 6 (six) hours as needed. 01/11/15   Lisa A Leftwich-Kirby, CNM  ibuprofen (ADVIL,MOTRIN) 800 MG tablet Take 1 tablet (800 mg total) by mouth 3 (three) times daily. Take on  schedule. 11/17/14   Dorathy KinsmanVirginia Smith, CNM  levonorgestrel (MIRENA) 20 MCG/24HR IUD 1 each by Intrauterine route once.    Historical Provider, MD  Melatonin 3 MG CAPS Take 1 capsule by mouth at bedtime.    Historical Provider, MD  oxyCODONE-acetaminophen (PERCOCET/ROXICET) 5-325 MG per tablet Take 1-2 tablets by mouth every 6 (six) hours as needed. 01/11/15   Misty StanleyLisa A Leftwich-Kirby, CNM  promethazine (PHENERGAN) 25 MG tablet Take 0.5 tablets (12.5 mg total) by mouth every 6 (six) hours as needed for nausea or vomiting. 01/11/15   Wilmer FloorLisa A Leftwich-Kirby, CNM  traMADol (ULTRAM) 50 MG tablet Take 1 tablet (50 mg total) by mouth every 6 (six) hours as needed. 03/29/16   Rolan BuccoMelanie Belfi, MD  valACYclovir (VALTREX) 1000 MG tablet Take 1 tablet (1,000 mg total) by mouth daily as needed. Patient not taking: Reported on 01/19/2015 11/17/14   Dorathy KinsmanVirginia Smith, CNM    Family History No family history on file.  Social History Social History  Substance Use Topics  . Smoking status: Current Every Day Smoker    Packs/day: 0.00    Types: Cigarettes  . Smokeless tobacco: Never Used  . Alcohol use Yes     Comment: occasional     Allergies  Augmentin [amoxicillin-pot clavulanate]; Stadol [butorphanol]; and Toradol [ketorolac tromethamine]   Review of Systems Review of Systems Ten systems reviewed and are negative for acute change, except as noted in the HPI.    Physical Exam Updated Vital Signs BP 130/92   Pulse 89   Temp 98.2 F (36.8 C) (Oral)   Resp 16   Ht  (1.626 m)   Wt 68.5 kg   LMP 05/14/2016   SpO2 97%   BMI 25.92 kg/m   Physical Exam  Constitutional: She is oriented to person, place, and time. She appears well-developed and well-nourished. No distress.  Tearful, sobbing  HENT:  Head: Normocephalic and atraumatic.  Eyes: Conjunctivae are normal. No scleral icterus.  Neck: Normal range of motion.  Cardiovascular: Normal rate, regular rhythm and normal heart sounds.  Exam reveals no  gallop and no friction rub.   No murmur heard. Pulmonary/Chest: Effort normal and breath sounds normal. No respiratory distress.  Abdominal: Soft. Bowel sounds are normal. She exhibits no distension and no mass. There is no tenderness. There is no guarding.  Neurological: She is alert and oriented to person, place, and time.  Skin: Skin is warm and dry. She is not diaphoretic.  Nursing note and vitals reviewed.    ED Treatments / Results  Labs (all labs ordered are listed, but only abnormal results are displayed) Labs Reviewed - No data to display  EKG  EKG Interpretation None       Radiology No results found.  Procedures Procedures (including critical care time)  Medications Ordered in ED Medications  LORazepam (ATIVAN) injection 1 mg (not administered)  ibuprofen (ADVIL,MOTRIN) tablet 800 mg (not administered)     Initial Impression / Assessment and Plan / ED Course  I have reviewed the triage vital signs and the nursing notes.  Pertinent labs & imaging results that were available during my care of the patient were reviewed by me and considered in my medical decision making (see chart for details).  Clinical Course    Patient headache and anxiety have resolved. She has stopped sobbing and feels improved. She will be d/c with 8 tablets of valium and 30 of hydroxyzine. She wishes to follow up with her mother's pcp [facility]) and I will give her a referral. She appears safe for d.c at this time.    Final Clinical Impressions(s) / ED Diagnoses   Final diagnoses:  None    New Prescriptions New Prescriptions   No medications on file     Arthor Captain, PA-C 05/14/16 1640    Doug Sou, MD 05/14/16 1844

## 2016-05-14 NOTE — ED Notes (Signed)
Patient mother at bedside at this time. Patient grandparents also at bedside. There is also a small child in the room.

## 2016-07-01 ENCOUNTER — Encounter (HOSPITAL_BASED_OUTPATIENT_CLINIC_OR_DEPARTMENT_OTHER): Payer: Self-pay | Admitting: *Deleted

## 2016-07-01 DIAGNOSIS — N764 Abscess of vulva: Secondary | ICD-10-CM | POA: Insufficient documentation

## 2016-07-01 DIAGNOSIS — F17210 Nicotine dependence, cigarettes, uncomplicated: Secondary | ICD-10-CM

## 2016-07-01 NOTE — ED Triage Notes (Signed)
Vaginal abscess x 3 months.  Pt very tearful.

## 2016-07-02 ENCOUNTER — Emergency Department (HOSPITAL_BASED_OUTPATIENT_CLINIC_OR_DEPARTMENT_OTHER)
Admission: EM | Admit: 2016-07-02 | Discharge: 2016-07-02 | Disposition: A | Discharge status: Home / Self Care | Payer: 59 | Source: Home / Self Care | Attending: Emergency Medicine | Admitting: Emergency Medicine

## 2016-07-02 ENCOUNTER — Inpatient Hospital Stay (HOSPITAL_COMMUNITY)
Admission: AD | Admit: 2016-07-02 | Discharge: 2016-07-02 | Disposition: A | Discharge status: Home / Self Care | Payer: 59 | Source: Ambulatory Visit | Attending: Family Medicine | Admitting: Family Medicine

## 2016-07-02 ENCOUNTER — Encounter (HOSPITAL_COMMUNITY): Payer: Self-pay

## 2016-07-02 DIAGNOSIS — L723 Sebaceous cyst: Secondary | ICD-10-CM

## 2016-07-02 DIAGNOSIS — L731 Pseudofolliculitis barbae: Secondary | ICD-10-CM | POA: Diagnosis not present

## 2016-07-02 DIAGNOSIS — N764 Abscess of vulva: Secondary | ICD-10-CM

## 2016-07-02 DIAGNOSIS — N9489 Other specified conditions associated with female genital organs and menstrual cycle: Secondary | ICD-10-CM | POA: Insufficient documentation

## 2016-07-02 DIAGNOSIS — N909 Noninflammatory disorder of vulva and perineum, unspecified: Secondary | ICD-10-CM | POA: Diagnosis present

## 2016-07-02 DIAGNOSIS — F17210 Nicotine dependence, cigarettes, uncomplicated: Secondary | ICD-10-CM | POA: Diagnosis not present

## 2016-07-02 DIAGNOSIS — N907 Vulvar cyst: Secondary | ICD-10-CM

## 2016-07-02 HISTORY — DX: Herpesviral infection, unspecified: B00.9

## 2016-07-02 LAB — URINALYSIS, ROUTINE W REFLEX MICROSCOPIC
BILIRUBIN URINE: NEGATIVE
GLUCOSE, UA: NEGATIVE mg/dL
Hgb urine dipstick: NEGATIVE
KETONES UR: NEGATIVE mg/dL
Nitrite: NEGATIVE
PROTEIN: NEGATIVE mg/dL
Specific Gravity, Urine: 1.025 (ref 1.005–1.030)
pH: 5.5 (ref 5.0–8.0)

## 2016-07-02 LAB — URINE MICROSCOPIC-ADD ON: RBC / HPF: NONE SEEN RBC/hpf (ref 0–5)

## 2016-07-02 LAB — POCT PREGNANCY, URINE: Preg Test, Ur: NEGATIVE

## 2016-07-02 MED ORDER — HYDROCODONE-ACETAMINOPHEN 5-325 MG PO TABS
1.0000 | ORAL_TABLET | Freq: Four times a day (QID) | ORAL | 0 refills | Status: DC | PRN
Start: 2016-07-02 — End: 2022-06-03

## 2016-07-02 NOTE — Discharge Instructions (Signed)
How to Take a Sitz Bath A sitz bath is a warm water bath that is taken while you are sitting down. The water should only come up to your hips and should cover your buttocks. Your health care provider may recommend a sitz bath to help you:   Clean the lower part of your body, including your genital area.  With itching.  With pain.  With sore muscles or muscles that tighten or spasm. HOW TO TAKE A SITZ BATH Take 3-4 sitz baths per day or as told by your health care provider. 1. Partially fill a bathtub with warm water. You will only need the water to be deep enough to cover your hips and buttocks when you are sitting in it. 2. If your health care provider told you to put medicine in the water, follow the directions exactly. 3. Sit in the water and open the tub drain a little. 4. Turn on the warm water again to keep the tub at the correct level. Keep the water running constantly. 5. Soak in the water for 15-20 minutes or as told by your health care provider. 6. After the sitz bath, pat the affected area dry first. Do not rub it. 7. Be careful when you stand up after the sitz bath because you may feel dizzy. SEEK MEDICAL CARE IF:  Your symptoms get worse. Do not continue with sitz baths if your symptoms get worse.  You have new symptoms. Do not continue with sitz baths until you talk with your health care provider.   This information is not intended to replace advice given to you by your health care provider. Make sure you discuss any questions you have with your health care provider.   Document Released: 06/15/2004 Document Revised: 02/07/2015 Document Reviewed: 09/21/2014 Elsevier Interactive Patient Education 2016 [employer] Sebaceous Cyst Removal, Care After Refer to this sheet in the next few weeks. These instructions provide you with information about caring for yourself after your procedure. Your health care provider may also give you more specific instructions. Your treatment  has been planned according to current medical practices, but problems sometimes occur. Call your health care provider if you have any problems or questions after your procedure. WHAT TO EXPECT AFTER THE PROCEDURE After your procedure, it is common to have:  Soreness in the area where your cyst was removed.  Tightness or itching from your skin sutures. HOME CARE INSTRUCTIONS  Take medicines only as directed by your health care provider.  If you were prescribed an antibiotic medicine, finish all of it even if you start to feel better.  Use antibiotic ointment as directed by your health care provider. Follow the instructions carefully.  There are many different ways to close and cover an incision, including stitches (sutures), skin glue, and adhesive strips. Follow your health care provider's instructions about:  Incision care.  Bandage (dressing) changes and removal.  Incision closure removal.  Keep the bandage (dressing) dry until your health care provider says that it can be removed. Take sponge baths only. Ask your health care provider when you can start showering or taking a bath.  After your dressing is off, check your incision every day for signs of infection. Watch for:  Redness, swelling, or pain.  Fluid, blood, or pus.  You can return to your normal activities. Do not do anything that stretches or puts pressure on your incision.  You can return to your normal diet.  Keep all follow-up visits as directed by your health  care provider. This is important. SEEK MEDICAL CARE IF:  You have a fever.  Your incision bleeds.  You have redness, swelling, or pain in the incision area.  You have fluid, blood, or pus coming from your incision.  Your cyst comes back after surgery.   This information is not intended to replace advice given to you by your health care provider. Make sure you discuss any questions you have with your health care provider.   Document Released:  10/14/2014 Document Reviewed: 10/14/2014 Elsevier Interactive Patient Education [employer]

## 2016-07-02 NOTE — MAU Note (Signed)
For 6 months now  Has had an abscess on the outside of rt labia, has been getting bigger , 3 months ago [facility].  Worst exp of her life, worse pain than natural childbirth was.   It never really went away, did the soaks as instructed. Now it is worse, way worse.  Went [facility] to [facility] last night, was told it would need to be lanced again, with less med than before.  It is affecting her daily life

## 2016-07-02 NOTE — ED Provider Notes (Signed)
[facility] Note   CSN: [id number] Arrival date & time: 07/01/16  2218 By signing my name below, I, Jenna Velez, attest that this documentation has been prepared under the direction and in the presence of Shon Baton, MD. Electronically Signed: Bridgette Velez, ED Scribe. 07/02/16. 12:55 AM.  History   Chief Complaint Chief Complaint  Patient presents with  . Abscess   HPI Comments: Jenna Velez is a 25 y.o. female who presents to the Emergency Department complaining of a vaginal abscess onset 3 months ago, worsening a couple days ago. Pt states she was seen here 3 months ago for her symptoms and had an I&D done but states it may have worsened it. She notes that drainage is still present and rates her current pain a 10/10. Pt states pain is exacerbated with urinating and palpation. She has taken Tylenol and applied warm soaks with mild relief. Patient reports that it opens up "every couple days." She took a course of antibiotics that did not help either. She followed up with her OB/GYN who "didn't do anything for me." Pt denies fever.   The history is provided by the patient. No language interpreter was used.    Past Medical History:  Diagnosis Date  . Anxiety   . Depression   . Gastritis   . GERD (gastroesophageal reflux disease)   . Ovarian cyst   . Panic attack     Patient Active Problem List   Diagnosis Date Noted  . Ovarian cyst 01/19/2015    Past Surgical History:  Procedure Laterality Date  . NO PAST SURGERIES      OB History    Gravida Para Term Preterm AB Living   1 1 1     1    SAB TAB Ectopic Multiple Live Births           1       Home Medications    Prior to Admission medications   Medication Sig Start Date End Date Taking? Authorizing Provider  bismuth subsalicylate (PEPTO BISMOL) 262 MG/15ML suspension Take 30 mLs by mouth every 6 (six) hours as needed for indigestion or diarrhea or loose stools.    Historical Provider, MD    diazepam (VALIUM) 5 MG tablet Take 0.5-1 tablets (2.5-5 mg total) by mouth every 6 (six) hours as needed for anxiety. 05/14/16   Arthor Captain, PA-C  HYDROcodone-acetaminophen (NORCO/VICODIN) 5-325 MG tablet Take 1 tablet by mouth every 6 (six) hours as needed. 07/02/16   Shon Baton, MD  hydrOXYzine (ATARAX/VISTARIL) 25 MG tablet Take 1 tablet (25 mg total) by mouth every 6 (six) hours as needed for anxiety. 05/14/16   Arthor Captain, PA-C  ibuprofen (ADVIL,MOTRIN) 600 MG tablet Take 1 tablet (600 mg total) by mouth every 6 (six) hours as needed. 01/11/15   Lisa A Leftwich-Kirby, CNM  ibuprofen (ADVIL,MOTRIN) 800 MG tablet Take 1 tablet (800 mg total) by mouth 3 (three) times daily. Take on schedule. 11/17/14   Dorathy Kinsman, CNM  levonorgestrel (MIRENA) 20 MCG/24HR IUD 1 each by Intrauterine route once.    Historical Provider, MD  Melatonin 3 MG CAPS Take 1 capsule by mouth at bedtime.    Historical Provider, MD  oxyCODONE-acetaminophen (PERCOCET/ROXICET) 5-325 MG per tablet Take 1-2 tablets by mouth every 6 (six) hours as needed. 01/11/15   Misty Stanley A Leftwich-Kirby, CNM  promethazine (PHENERGAN) 25 MG tablet Take 0.5 tablets (12.5 mg total) by mouth every 6 (six) hours as needed for nausea or vomiting.  01/11/15   Wilmer Floor Leftwich-Kirby, CNM  traMADol (ULTRAM) 50 MG tablet Take 1 tablet (50 mg total) by mouth every 6 (six) hours as needed. 03/29/16   Rolan Bucco, MD  valACYclovir (VALTREX) 1000 MG tablet Take 1 tablet (1,000 mg total) by mouth daily as needed. Patient not taking: Reported on 01/19/2015 11/17/14   Dorathy Kinsman, CNM    Family History History reviewed. No pertinent family history.  Social History Social History  Substance Use Topics  . Smoking status: Current Every Day Smoker    Packs/day: 0.00    Types: Cigarettes  . Smokeless tobacco: Never Used  . Alcohol use Yes     Comment: occasional     Allergies   Augmentin [amoxicillin-pot clavulanate]; Stadol [butorphanol]; and  Toradol [ketorolac tromethamine]   Review of Systems Review of Systems  Constitutional: Negative for fever.  Genitourinary: Positive for dysuria and vaginal pain.  Skin: Positive for wound.  All other systems reviewed and are negative.    Physical Exam Updated Vital Signs BP 102/63 (BP Location: Right Arm)   Pulse 72   Temp 98.3 F (36.8 C) (Oral)   Resp 20   Ht 5\' 4"  (1.626 m)   Wt 142 lb (64.4 kg)   SpO2 98%   BMI 24.37 kg/m   Physical Exam  Constitutional: She is oriented to person, place, and time. She appears well-developed and well-nourished.  Tearful  HENT:  Head: Normocephalic and atraumatic.  Cardiovascular: Normal rate and regular rhythm.   Pulmonary/Chest: Effort normal. No respiratory distress.  Genitourinary:  Genitourinary Comments: External GU exam with small area of fluctuance over the labia majora with pustular head, tenderness to palpation, no spontaneous drainage, no significant surrounding erythema or induration, clitoral hood piercing noted  Neurological: She is alert and oriented to person, place, and time.  Skin: Skin is warm and dry.  Psychiatric:  Anxious  Nursing note and vitals reviewed.    ED Treatments / Results  DIAGNOSTIC STUDIES: Oxygen Saturation is 100% on RA, normal by my interpretation.    COORDINATION OF CARE: 12:53 AM Discussed treatment plan with pt at bedside which includes ultrasound and I&D and pt agreed to plan.  Labs (all labs ordered are listed, but only abnormal results are displayed) Labs Reviewed - No data to display  EKG  EKG Interpretation None       Radiology No results found.  Procedures Procedures (including critical care time)  Medications Ordered in ED Medications - No data to display   Initial Impression / Assessment and Plan / ED Course  I have reviewed the triage vital signs and the nursing notes.  Pertinent labs & imaging results that were available during my care of the patient were  reviewed by me and considered in my medical decision making (see chart for details).  Clinical Course    Patient presents with recurrent labial abscess. She reports that it never healed and has spontaneously drained over the last several months. She reports that prior incision and drainage was very traumatic and "I can't go through that again." On physical exam she has a small area of fluctuance less than 0.5 cm without significant cellulitis or erythema. Bedside ultrasound confirms minimal fluid collection of less than 0.5 x 0.5 cm. Discussed with the patient that she likely needs repeat incision and drainage and placement of a wick possible. Patient adamantly refuses procedure. She states "it hurt too bad." I discussed with the patient that this is likely the only way that this  will go away. She continues to refuse. I have recommended continuing sitz baths and hot compresses. [facility] She'll be given a short course of pain medication. No significant evidence of cellulitis. No indication at this time for antibiotics.  After history, exam, and medical workup I feel the patient has been appropriately medically screened and is safe for discharge home. Pertinent diagnoses were discussed with the patient. Patient was given return precautions.   Final Clinical Impressions(s) / ED Diagnoses   Final diagnoses:  Labial abscess   I personally performed the services described in this documentation, which was scribed in my presence. The recorded information has been reviewed and is accurate.   New Prescriptions New Prescriptions   HYDROCODONE-ACETAMINOPHEN (NORCO/VICODIN) 5-325 MG TABLET    Take 1 tablet by mouth every 6 (six) hours as needed.     Shon Batonourtney F Cortlyn Cannell, MD 07/02/16 [id number]

## 2016-07-02 NOTE — MAU Provider Note (Signed)
After informed consent, area cleaned with alcohol.  10 cc of Lidocaine infiltrated.  Scalpel used to make a small incision and blood removed. A cyst wall was not positively identified. Some induration noted at the inferior aspect of the area and a long pubic hair which had been ingrown was removed.

## 2016-07-02 NOTE — ED Notes (Signed)
C/o vaginal abscess x 3 months,  Was seen here for same states has gotten worse w increased pain, low grade fever, swelling, drainage, tender, purple, red

## 2016-07-02 NOTE — MAU Provider Note (Signed)
History     CSN: [id number]  Arrival date and time: 07/02/16 1157   First Provider Initiated Contact with Patient 07/02/16 1321      No chief complaint on file.  Jenna Velez is a 25 y.o. G1P1001 presenting with a chronic painful right labial lesion. She describes extreme tenderness making it difficult to touch or walk normally. It lasts drained yesterday prior to her going to [place] [facility]. They offered to incise the lesion however she refused due to extreme pain when this was done 03/29/2016. She states there was no improvement after the procedure. The lesion has been present for several months draining intermittently and increasing in size at times to maximum of a few centimeters. She clips but does not shave her pubic hair. She had a similar lesion in the past in right axilla which was I&D'd without complication.      OB History  Gravida Para Term Preterm AB Living  1 1 1     1   SAB TAB Ectopic Multiple Live Births          1    # Outcome Date GA Lbr Len/2nd Weight Sex Delivery Anes PTL Lv  1 Term     M Vag-Spont   LIV       Past Medical History:  Diagnosis Date  . Anxiety   . Depression   . Gastritis   . GERD (gastroesophageal reflux disease)   . HSV (herpes simplex virus) infection   . Ovarian cyst   . Panic attack     Past Surgical History:  Procedure Laterality Date  . NO PAST SURGERIES      Family History  Problem Relation Age of Onset  . Diabetes Maternal Grandmother   . Cancer Maternal Grandmother     Social History  Substance Use Topics  . Smoking status: Current Every Day Smoker    Packs/day: 0.00    Types: Cigarettes  . Smokeless tobacco: Never Used  . Alcohol use Yes     Comment: occasional    Allergies:  Allergies  Allergen Reactions  . Augmentin [Amoxicillin-Pot Clavulanate] Hives    Has patient had a PCN reaction causing immediate rash, facial/tongue/throat swelling, SOB or lightheadedness with hypotension: Yes Has patient had a PCN  reaction causing severe rash involving mucus membranes or skin necrosis: Yes Has patient had a PCN reaction that required hospitalization No Has patient had a PCN reaction occurring within the last 10 years: Yes If all of the above answers are "NO", then may proceed with Cephalosporin use.   . Stadol [Butorphanol] Nausea And Vomiting  . Toradol [Ketorolac Tromethamine] Nausea And Vomiting    Able to take ibuprofen    Prescriptions Prior to Admission  Medication Sig Dispense Refill Last Dose  . HYDROcodone-acetaminophen (NORCO/VICODIN) 5-325 MG tablet Take 1 tablet by mouth every 6 (six) hours as needed. (Patient taking differently: Take 1 tablet by mouth every 6 (six) hours as needed for moderate pain. ) 10 tablet 0 07/01/2016 at Unknown time  . valACYclovir (VALTREX) 1000 MG tablet Take 1 tablet (1,000 mg total) by mouth daily as needed. 20 tablet 0 Past Month at Unknown time  . diazepam (VALIUM) 5 MG tablet Take 0.5-1 tablets (2.5-5 mg total) by mouth every 6 (six) hours as needed for anxiety. (Patient not taking: Reported on 07/02/2016) 8 tablet 0 Not Taking at Unknown time  . hydrOXYzine (ATARAX/VISTARIL) 25 MG tablet Take 1 tablet (25 mg total) by mouth every 6 (six) hours  as needed for anxiety. (Patient not taking: Reported on 07/02/2016) 12 tablet 0 Not Taking at Unknown time  . levonorgestrel (MIRENA) 20 MCG/24HR IUD 1 each by Intrauterine route once.     . traMADol (ULTRAM) 50 MG tablet Take 1 tablet (50 mg total) by mouth every 6 (six) hours as needed. (Patient not taking: Reported on 07/02/2016) 15 tablet 0 Not Taking at Unknown time    Review of Systems  Constitutional: Negative for fever.  Genitourinary: Negative for dysuria.  Psychiatric/Behavioral: The patient is nervous/anxious.    Physical Exam   Blood pressure 109/64, pulse 97, temperature 98.3 F (36.8 C), temperature source Oral, resp. rate 18.  Physical Exam  Nursing note and vitals reviewed. Constitutional: She is  oriented to person, place, and time. She appears well-developed and well-nourished. She appears distressed.  Tearful, anxious  HENT:  Head: Normocephalic.  Eyes: Pupils are equal, round, and reactive to light.  Neck: Normal range of motion.  Cardiovascular: Normal rate.   Respiratory: Effort normal.  GI: Soft. There is no tenderness.  Genitourinary:  Genitourinary Comments: External genitalia remarkable for 2-3cm area of swelling and tenderness at site of healed scar on right upper labia majora. Unable to express any drainage. Overlying skin is not erythematous Pubic hair short;clitoral ring present  Musculoskeletal: Normal range of motion.  Neurological: She is alert and oriented to person, place, and time.  Skin: Skin is warm and dry.    MAU Course  Procedures See Procedure Note of Dr. Shawnie Pons thsi date  MDM Consulted Dr. Shawnie Pons to see patient  Assessment and Plan   1. Ingrown hair   2. Sebaceous cyst of labia      Medication List    STOP taking these medications   diazepam 5 MG tablet Commonly known as:  VALIUM   hydrOXYzine 25 MG tablet Commonly known as:  ATARAX/VISTARIL   traMADol 50 MG tablet Commonly known as:  ULTRAM     TAKE these medications   HYDROcodone-acetaminophen 5-325 MG tablet Commonly known as:  NORCO/VICODIN Take 1 tablet by mouth every 6 (six) hours as needed. What changed:  reasons to take this   levonorgestrel 20 MCG/24HR IUD Commonly known as:  MIRENA 1 each by Intrauterine route once.   valACYclovir 1000 MG tablet Commonly known as:  VALTREX Take 1 tablet (1,000 mg total) by mouth daily as needed.      Follow-up Information    Reva Bores, MD. Call today.   Specialty:  Obstetrics and Gynecology Why:  Call for appointment if recurreence occurs  Contact information: [address] [place] [address] [phone]           Sayre Mazor 07/02/2016, 2:13 PM

## 2016-07-02 NOTE — Discharge Instructions (Signed)
You were seen today for recurrent labial abscess. You are declining further incision and drainage given prior experience. You need to continue warm soaks and compresses to the area. Ultimately it does need to be further labs to allow for healing from the inside out. You will be given referral to [facility] clinic. If you note increasing redness, swelling or any new or worsening symptoms she needs to be reevaluated.

## 2016-07-11 ENCOUNTER — Emergency Department (HOSPITAL_COMMUNITY)
Admission: EM | Admit: 2016-07-11 | Discharge: 2016-07-11 | Disposition: A | Discharge status: Home / Self Care | Payer: 59 | Attending: Emergency Medicine | Admitting: Emergency Medicine

## 2016-07-11 ENCOUNTER — Encounter (HOSPITAL_COMMUNITY): Payer: Self-pay | Admitting: *Deleted

## 2016-07-11 DIAGNOSIS — N907 Vulvar cyst: Secondary | ICD-10-CM

## 2016-07-11 DIAGNOSIS — N9489 Other specified conditions associated with female genital organs and menstrual cycle: Secondary | ICD-10-CM | POA: Insufficient documentation

## 2016-07-11 DIAGNOSIS — F17210 Nicotine dependence, cigarettes, uncomplicated: Secondary | ICD-10-CM | POA: Insufficient documentation

## 2016-07-11 DIAGNOSIS — N764 Abscess of vulva: Secondary | ICD-10-CM | POA: Diagnosis not present

## 2016-07-11 LAB — CBC WITH DIFFERENTIAL/PLATELET
Basophils Absolute: 0 10*3/uL (ref 0.0–0.1)
Basophils Relative: 0 %
EOS ABS: 0.1 10*3/uL (ref 0.0–0.7)
Eosinophils Relative: 1 %
HEMATOCRIT: 43.7 % (ref 36.0–46.0)
Hemoglobin: 14.4 g/dL (ref 12.0–15.0)
LYMPHS ABS: 2.3 10*3/uL (ref 0.7–4.0)
Lymphocytes Relative: 21 %
MCH: 32.5 pg (ref 26.0–34.0)
MCHC: 33 g/dL (ref 30.0–36.0)
MCV: 98.6 fL (ref 78.0–100.0)
MONOS PCT: 8 %
Monocytes Absolute: 0.9 10*3/uL (ref 0.1–1.0)
NEUTROS ABS: 7.9 10*3/uL — AB (ref 1.7–7.7)
Neutrophils Relative %: 70 %
Platelets: 248 10*3/uL (ref 150–400)
RBC: 4.43 MIL/uL (ref 3.87–5.11)
RDW: 12.7 % (ref 11.5–15.5)
WBC: 11.1 10*3/uL — AB (ref 4.0–10.5)

## 2016-07-11 LAB — COMPREHENSIVE METABOLIC PANEL
ALT: 30 U/L (ref 14–54)
ANION GAP: 7 (ref 5–15)
AST: 23 U/L (ref 15–41)
Albumin: 4.2 g/dL (ref 3.5–5.0)
Alkaline Phosphatase: 68 U/L (ref 38–126)
BUN: 11 mg/dL (ref 6–20)
CHLORIDE: 105 mmol/L (ref 101–111)
CO2: 26 mmol/L (ref 22–32)
Calcium: 9.4 mg/dL (ref 8.9–10.3)
Creatinine, Ser: 0.86 mg/dL (ref 0.44–1.00)
Glucose, Bld: 96 mg/dL (ref 65–99)
POTASSIUM: 4.6 mmol/L (ref 3.5–5.1)
SODIUM: 138 mmol/L (ref 135–145)
Total Bilirubin: 0.4 mg/dL (ref 0.3–1.2)
Total Protein: 7.2 g/dL (ref 6.5–8.1)

## 2016-07-11 MED ORDER — DOXYCYCLINE HYCLATE 100 MG PO CAPS
100.0000 mg | ORAL_CAPSULE | Freq: Two times a day (BID) | ORAL | 0 refills | Status: DC
Start: 2016-07-11 — End: 2022-06-03

## 2016-07-11 MED ORDER — OXYCODONE-ACETAMINOPHEN 5-325 MG PO TABS
1.0000 | ORAL_TABLET | Freq: Once | ORAL | Status: AC
  Administered 2016-07-11: 1 via ORAL

## 2016-07-11 MED ORDER — HYDROCODONE-ACETAMINOPHEN 5-325 MG PO TABS
1.0000 | ORAL_TABLET | Freq: Once | ORAL | Status: AC
  Administered 2016-07-11: 1 via ORAL
  Filled 2016-07-11: qty 1

## 2016-07-11 MED ORDER — ONDANSETRON 4 MG PO TBDP
ORAL_TABLET | ORAL | Status: AC
  Filled 2016-07-11: qty 1

## 2016-07-11 MED ORDER — HYDROCODONE-ACETAMINOPHEN 5-325 MG PO TABS: 1.0000 | ORAL_TABLET | Freq: Four times a day (QID) | ORAL | 0 refills | Status: DC | PRN

## 2016-07-11 MED ORDER — DOXYCYCLINE HYCLATE 100 MG PO TABS
100.0000 mg | ORAL_TABLET | Freq: Once | ORAL | Status: AC
  Administered 2016-07-11: 100 mg via ORAL
  Filled 2016-07-11: qty 1

## 2016-07-11 MED ORDER — ONDANSETRON 4 MG PO TBDP
4.0000 mg | ORAL_TABLET | Freq: Once | ORAL | Status: AC
  Administered 2016-07-11: 4 mg via ORAL

## 2016-07-11 MED ORDER — OXYCODONE-ACETAMINOPHEN 5-325 MG PO TABS
ORAL_TABLET | ORAL | Status: DC
Start: 2016-07-11 — End: 2016-07-11
  Filled 2016-07-11: qty 1

## 2016-07-11 NOTE — Discharge Instructions (Signed)
As we discussed the various treatment options would understand that you have chosen to give a trial of antibiotics and see if it settles down. Will need to return if the abscess gets worse. It may still require incision and drainage. Make an appointment with your new OB/GYN as soon as possible and when she have your paperwork completed. Take the doxycycline antibiotic for the next 7 days. Take pain medicine as needed. Work note provided through the weekend.

## 2016-07-11 NOTE — ED Triage Notes (Signed)
Pt c/o vaginal abscess. Recent had it drained at [facility] 9/26. Pt states abscess is red, warm swollen.

## 2016-07-11 NOTE — ED Provider Notes (Signed)
[facility]-EMERGENCY DEPT Provider Note   CSN: [id number] Arrival date & time: 07/11/16  1654     History   Chief Complaint Chief Complaint  Patient presents with  . Abscess    HPI Jenna Velez is a 25 y.o. female.  Patient with recurrence of a cyst in her right groin area that has benignID twice before. Most recently September 26 at [facility] hospital by GYN. At that time they felt that it could've been a sebaceous cyst and they attempted to remove some of the capsule. It reoccurred within 1 week. Same location. Patient's had similar type cyst under her axilla area one time before and she's been dealing with this being infected since June on and off.      Past Medical History:  Diagnosis Date  . Anxiety   . Depression   . Gastritis   . GERD (gastroesophageal reflux disease)   . HSV (herpes simplex virus) infection   . Ovarian cyst   . Panic attack     Patient Active Problem List   Diagnosis Date Noted  . Ovarian cyst 01/19/2015    Past Surgical History:  Procedure Laterality Date  . NO PAST SURGERIES      OB History    Gravida Para Term Preterm AB Living   1 1 1     1    SAB TAB Ectopic Multiple Live Births           1       Home Medications    Prior to Admission medications   Medication Sig Start Date End Date Taking? Authorizing Provider  acetaminophen (TYLENOL) 500 MG tablet Take 500 mg by mouth every 6 (six) hours as needed for mild pain.   Yes Historical Provider, MD  levonorgestrel (MIRENA) 20 MCG/24HR IUD 1 each by Intrauterine route once.   Yes Historical Provider, MD  doxycycline (VIBRAMYCIN) 100 MG capsule Take 1 capsule (100 mg total) by mouth 2 (two) times daily. 07/11/16   Vanetta Mulders, MD  HYDROcodone-acetaminophen (NORCO/VICODIN) 5-325 MG tablet Take 1 tablet by mouth every 6 (six) hours as needed. Patient not taking: Reported on 07/11/2016 07/02/16   Shon Baton, MD  HYDROcodone-acetaminophen (NORCO/VICODIN) 5-325 MG tablet Take 1-2  tablets by mouth every 6 (six) hours as needed for moderate pain. 07/11/16   Vanetta Mulders, MD  valACYclovir (VALTREX) 1000 MG tablet Take 1 tablet (1,000 mg total) by mouth daily as needed. Patient not taking: Reported on 07/11/2016 11/17/14   Dorathy Kinsman, CNM    Family History Family History  Problem Relation Age of Onset  . Diabetes Maternal Grandmother   . Cancer Maternal Grandmother     Social History Social History  Substance Use Topics  . Smoking status: Current Every Day Smoker    Packs/day: 0.00    Types: Cigarettes  . Smokeless tobacco: Never Used  . Alcohol use Yes     Comment: occasional     Allergies   Augmentin [amoxicillin-pot clavulanate]; Stadol [butorphanol]; and Toradol [ketorolac tromethamine]   Review of Systems Review of Systems  Constitutional: Negative for fever.  HENT: Negative for congestion.   Respiratory: Negative for shortness of breath.   Cardiovascular: Negative for chest pain.  Gastrointestinal: Negative for abdominal pain.  Genitourinary: Negative for dysuria.  Musculoskeletal: Negative for back pain.  Skin: Positive for wound.  Neurological: Negative for headaches.  Hematological: Does not bruise/bleed easily.  Psychiatric/Behavioral: Negative for confusion.     Physical Exam Updated Vital Signs BP 104/63  Pulse 63   Temp 98.7 F (37.1 C) (Oral)   SpO2 99%   Physical Exam  Constitutional: She is oriented to person, place, and time. She appears well-developed and well-nourished. No distress.  HENT:  Head: Normocephalic and atraumatic.  Eyes: Conjunctivae and EOM are normal. Pupils are equal, round, and reactive to light.  Neck: Normal range of motion. Neck supple.  Cardiovascular: Normal rate, regular rhythm and normal heart sounds.   Pulmonary/Chest: Effort normal and breath sounds normal. No respiratory distress.  Abdominal: Soft. Bowel sounds are normal. There is no tenderness.  Genitourinary:  Genitourinary  Comments: Normal external genitalia. Right groin area with a 5 mm area of fluctuance 1.5 mm area of induration at previous I&D site. Not involving the Bartholin's gland. No significant erythema. No sniffing groin adenopathy. The poor present to the lateral aspect of the area of inflammation. No purulent drainage.  Neurological: She is alert and oriented to person, place, and time. No cranial nerve deficit. She exhibits normal muscle tone. Coordination normal.  Skin: Skin is warm.  Nursing note and vitals reviewed.    ED Treatments / Results  Labs (all labs ordered are listed, but only abnormal results are displayed) Labs Reviewed  CBC WITH DIFFERENTIAL/PLATELET - Abnormal; Notable for the following:       Result Value   WBC 11.1 (*)    Neutro Abs 7.9 (*)    All other components within normal limits  COMPREHENSIVE METABOLIC PANEL    EKG  EKG Interpretation None       Radiology No results found.  Procedures Procedures (including critical care time)  Medications Ordered in ED Medications  oxyCODONE-acetaminophen (PERCOCET/ROXICET) 5-325 MG per tablet 1 tablet (1 tablet Oral Given 07/11/16 1706)  ondansetron (ZOFRAN-ODT) disintegrating tablet 4 mg (4 mg Oral Given 07/11/16 1706)  HYDROcodone-acetaminophen (NORCO/VICODIN) 5-325 MG per tablet 1 tablet (1 tablet Oral Given 07/11/16 2020)  doxycycline (VIBRA-TABS) tablet 100 mg (100 mg Oral Given 07/11/16 2020)     Initial Impression / Assessment and Plan / ED Course  I have reviewed the triage vital signs and the nursing notes.  Pertinent labs & imaging results that were available during my care of the patient were reviewed by me and considered in my medical decision making (see chart for details).  Clinical Course    The patient provides a history of trouble with a right-sided groin cyst abscess since June. It has been I&D 2 times prior. Most recently by GYN at [facility] hospital on September 26. It recurred within a week. It is  not consistent with a orifice gland cyst. Is consistent either with hidradenitis or sebaceous cyst. I think it's consistent with a sebaceous cyst based on the deep poor. Offered patient I&D again she was reluctant to do that somewhat understandably and once to do a trial of antibiotics and see if this thing will calm down. She realizes that she probably benefit from having this excised entirely when it is not acutely infected. Patient started on doxycycline. Also given pain medicine. She will have a new OB/GYN of her own within 2 weeks. Patient will return if anything gets worse. Patient nontoxic no acute distress.  Final Clinical Impressions(s) / ED Diagnoses   Final diagnoses:  Sebaceous cyst of labia    New Prescriptions New Prescriptions   DOXYCYCLINE (VIBRAMYCIN) 100 MG CAPSULE    Take 1 capsule (100 mg total) by mouth 2 (two) times daily.   HYDROCODONE-ACETAMINOPHEN (NORCO/VICODIN) 5-325 MG TABLET    Take  1-2 tablets by mouth every 6 (six) hours as needed for moderate pain.     Vanetta MuldersScott Joby Richart, MD 07/11/16 2037

## 2016-09-08 DIAGNOSIS — Z793 Long term (current) use of hormonal contraceptives: Secondary | ICD-10-CM | POA: Diagnosis not present

## 2016-09-08 DIAGNOSIS — K769 Liver disease, unspecified: Secondary | ICD-10-CM | POA: Diagnosis not present

## 2016-09-08 DIAGNOSIS — K219 Gastro-esophageal reflux disease without esophagitis: Secondary | ICD-10-CM | POA: Diagnosis not present

## 2016-09-08 DIAGNOSIS — F17210 Nicotine dependence, cigarettes, uncomplicated: Secondary | ICD-10-CM | POA: Diagnosis not present

## 2016-09-08 DIAGNOSIS — Z880 Allergy status to penicillin: Secondary | ICD-10-CM | POA: Diagnosis not present

## 2016-09-08 DIAGNOSIS — N898 Other specified noninflammatory disorders of vagina: Secondary | ICD-10-CM | POA: Diagnosis not present

## 2016-09-08 DIAGNOSIS — N83201 Unspecified ovarian cyst, right side: Secondary | ICD-10-CM | POA: Diagnosis not present

## 2016-09-08 DIAGNOSIS — R110 Nausea: Secondary | ICD-10-CM | POA: Diagnosis not present

## 2016-09-08 DIAGNOSIS — Z888 Allergy status to other drugs, medicaments and biological substances status: Secondary | ICD-10-CM | POA: Diagnosis not present

## 2016-09-08 DIAGNOSIS — R1031 Right lower quadrant pain: Secondary | ICD-10-CM | POA: Diagnosis not present

## 2016-12-23 DIAGNOSIS — R102 Pelvic and perineal pain: Secondary | ICD-10-CM | POA: Diagnosis not present

## 2022-06-03 ENCOUNTER — Encounter: Payer: Self-pay | Admitting: Emergency Medicine

## 2022-06-03 ENCOUNTER — Ambulatory Visit
Admission: EM | Admit: 2022-06-03 | Discharge: 2022-06-03 | Disposition: A | Discharge status: Home / Self Care | Payer: 59 | Attending: Family Medicine | Admitting: Family Medicine

## 2022-06-03 DIAGNOSIS — Z20822 Contact with and (suspected) exposure to covid-19: Secondary | ICD-10-CM | POA: Insufficient documentation

## 2022-06-03 DIAGNOSIS — M5441 Lumbago with sciatica, right side: Secondary | ICD-10-CM | POA: Insufficient documentation

## 2022-06-03 DIAGNOSIS — R051 Acute cough: Secondary | ICD-10-CM | POA: Insufficient documentation

## 2022-06-03 DIAGNOSIS — M62830 Muscle spasm of back: Secondary | ICD-10-CM | POA: Insufficient documentation

## 2022-06-03 MED ORDER — METHYLPREDNISOLONE 4 MG PO TBPK: ORAL_TABLET | ORAL | 0 refills | Status: DC

## 2022-06-03 MED ORDER — CYCLOBENZAPRINE HCL 5 MG PO TABS
5.0000 mg | ORAL_TABLET | Freq: Three times a day (TID) | ORAL | 0 refills | Status: DC | PRN
Start: 2022-06-03 — End: 2023-06-04

## 2022-06-03 NOTE — ED Triage Notes (Addendum)
Patient presents to Urgent Care with complaints of lower back pain since 2 days ago. Patient reports wearing heels all day and felt sharp shooting pain of right lower back. Has used heating pad, massage with no relief.   Patient also have a cough last night. Feels like allergies. 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 She did have fever 100.7 F this morning around 3 am but broke. Does need a covid test since work took her off the schedule today. She did a home covid test was negative but work wanted one from doctor.

## 2022-06-03 NOTE — Discharge Instructions (Signed)
Continue to reduce activity with antibiotics. Make muscle relaxer cyclobenzaprine 3 times Take Medrol Dosepak as directed.  Take developed Today Your COVID test will be available on MyChart.

## 2022-06-03 NOTE — ED Provider Notes (Signed)
[facility]    CSN: [id number] Arrival date & time: 06/03/22  1632      History   Chief Complaint Chief Complaint  Patient presents with   Cough   Back Pain    Lower right    HPI Jenna Velez is a 31 y.o. female.   HPI  Patient is here for 2 reasons.  First she has low back pain.  It is in the right low back from her low back through to her buttock.  Worse with movement.  Better with rest.  Not responding to home treatment. Second she has a cough.  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  She has called off for tomorrow.  She states she does not have any fever chills, headache or body aches.  Her temperature was 100.7 yesterday.  Today it has been normal  Past Medical History:  Diagnosis Date   Anxiety    Depression    Gastritis    GERD (gastroesophageal reflux disease)    HSV (herpes simplex virus) infection    Ovarian cyst    Panic attack     Patient Active Problem List   Diagnosis Date Noted   Ovarian cyst 01/19/2015    Past Surgical History:  Procedure Laterality Date   NO PAST SURGERIES      OB History     Gravida  1   Para  1   Term  1   Preterm      AB      Living  1      SAB      IAB      Ectopic      Multiple      Live Births  1            Home Medications    Prior to Admission medications   Medication Sig Start Date End Date Taking? Authorizing Provider  cyclobenzaprine (FLEXERIL) 5 MG tablet Take 1 tablet (5 mg total) by mouth 3 (three) times daily as needed for muscle spasms. 06/03/22  Yes Eustace Moore, MD  levonorgestrel [facility]) 20 MCG/24HR IUD 1 each by Intrauterine route once.   Yes [provider]  methylPREDNISolone (MEDROL DOSEPAK) 4 MG TBPK tablet tad 06/03/22  Yes Eustace Moore, MD    Family History Family History  Problem Relation Age of Onset   Diabetes Maternal Grandmother    Cancer Maternal Grandmother      Social History Social History   Tobacco Use   Smoking status: Every Day    Packs/day: 0.00    Types: Cigarettes   Smokeless tobacco: Never  Substance Use Topics   Alcohol use: Yes    Comment: occasional   Drug use: Yes    Types: Marijuana    Comment: 1 month ago      Allergies   Augmentin [amoxicillin-pot clavulanate], Stadol [butorphanol], and Toradol [ketorolac tromethamine]   Review of Systems Review of Systems  See HPI Physical Exam Triage Vital Signs ED Triage Vitals  Enc Vitals Group     BP 06/03/22 1701 113/76     Pulse Rate 06/03/22 1701 95     Resp 06/03/22 1701 16     Temp 06/03/22 1701 98.5 F (36.9 C)     Temp Source 06/03/22 1701 Oral     SpO2 06/03/22 1701 97 %     Weight --  Height --      Head Circumference --      Peak Flow --      Pain Score 06/03/22 1658 7     Pain Loc --      Pain Edu? --      Excl. in GC? --    No data found.  Updated Vital Signs BP 113/76 (BP Location: Right Arm)   Pulse 95   Temp 98.5 F (36.9 C) (Oral)   Resp 16   SpO2 97%      Physical Exam Constitutional:      General: She is not in acute distress.    Appearance: She is well-developed and normal weight. She is ill-appearing.     Comments: Appears uncomfortable  HENT:     Head: Normocephalic and atraumatic.     Right Ear: Tympanic membrane and ear canal normal.     Left Ear: Tympanic membrane and ear canal normal.     Nose: Nose normal.     Mouth/Throat:     Pharynx: No posterior oropharyngeal erythema.  Eyes:     Conjunctiva/sclera: Conjunctivae normal.     Pupils: Pupils are equal, round, and reactive to light.  Cardiovascular:     Rate and Rhythm: Normal rate and regular rhythm.     Heart sounds: Normal heart sounds.  Pulmonary:     Effort: Pulmonary effort is normal. No respiratory distress.     Breath sounds: Normal breath sounds.  Abdominal:     General: There is no distension.     Palpations: Abdomen is soft.  Musculoskeletal:         General: Normal range of motion.     Cervical back: Normal range of motion.     Comments: Patient has visible and palpable muscle spasm in the right lumbar column of muscles.  Tenderness over the SI.  Limited range of motion of back secondary to pain.  Strength and sensation normal lower extremities.  Reflexes and straight leg raise are normal in lower extremities.  Lymphadenopathy:     Cervical: No cervical adenopathy.  Skin:    General: Skin is warm and dry.  Neurological:     General: No focal deficit present.     Mental Status: She is alert.     Coordination: Coordination normal.     Gait: Gait normal.     Deep Tendon Reflexes: Reflexes normal.  Psychiatric:        Mood and Affect: Mood normal.        Behavior: Behavior normal.      UC Treatments / Results  Labs (all labs ordered are listed, but only abnormal results are displayed) Labs Reviewed  SARS CORONAVIRUS 2 BY RT PCR    EKG   Radiology No results found.  Procedures Procedures (including critical care time)  Medications Ordered in UC Medications - No data to display  Initial Impression / Assessment and Plan / UC Course  I have reviewed the triage vital signs and the nursing notes.  Pertinent labs & imaging results that were available during my care of the patient were reviewed by me and considered in my medical decision making (see chart for details).     Final Clinical Impressions(s) / UC Diagnoses   Final diagnoses:  Acute right-sided low back pain with right-sided sciatica  Spasm of muscle of lower back  Acute cough     Discharge Instructions      Continue to reduce activity with antibiotics. Make muscle relaxer cyclobenzaprine  3 times Take Medrol Dosepak as directed.  Take developed Today Your COVID test will be available on MyChart.   ED Prescriptions     Medication Sig Dispense Auth. Provider   methylPREDNISolone (MEDROL DOSEPAK) 4 MG TBPK tablet tad 21 tablet Eustace Moore, MD   cyclobenzaprine (FLEXERIL) 5 MG tablet Take 1 tablet (5 mg total) by mouth 3 (three) times daily as needed for muscle spasms. 30 tablet Eustace Moore, MD      PDMP not reviewed this encounter.   Eustace Moore, MD 06/03/22 [phone]

## 2022-06-04 ENCOUNTER — Telehealth: Payer: Self-pay | Admitting: Emergency Medicine

## 2022-06-04 LAB — SARS CORONAVIRUS 2 BY RT PCR: SARS Coronavirus 2 by RT PCR: NEGATIVE

## 2023-06-04 ENCOUNTER — Ambulatory Visit
Admission: EM | Admit: 2023-06-04 | Discharge: 2023-06-04 | Disposition: A | Discharge status: Home / Self Care | Payer: Managed Care, Other (non HMO) | Attending: Family Medicine | Admitting: Family Medicine

## 2023-06-04 DIAGNOSIS — J069 Acute upper respiratory infection, unspecified: Secondary | ICD-10-CM | POA: Diagnosis not present

## 2023-06-04 LAB — POC SARS CORONAVIRUS 2 AG -  ED: SARS Coronavirus 2 Ag: NEGATIVE

## 2023-06-04 MED ORDER — PREDNISONE 20 MG PO TABS
40.0000 mg | ORAL_TABLET | Freq: Every day | ORAL | 0 refills | Status: DC
Start: 2023-06-04 — End: 2023-10-06

## 2023-06-04 MED ORDER — BENZONATATE 200 MG PO CAPS: 200.0000 mg | ORAL_CAPSULE | Freq: Two times a day (BID) | ORAL | 0 refills | Status: DC | PRN

## 2023-06-04 MED ORDER — HYDROCOD POLI-CHLORPHE POLI ER 10-8 MG/5ML PO SUER: 5.0000 mL | Freq: Every evening | ORAL | 0 refills | Status: DC | PRN

## 2023-06-04 NOTE — Discharge Instructions (Signed)
Continue drinking lots of water Take the Tessalon 2 times a day with the Mucinex DM or Delsym product Take prednisone daily for 5 days I have prescribed Tussionex to take at bedtime so you can sleep Call for problems

## 2023-06-04 NOTE — ED Provider Notes (Signed)
[facility]    CSN: [id number] Arrival date & time: 06/04/23  1145      History   Chief Complaint Chief Complaint  Patient presents with   Nasal Congestion    HPI Jenna Velez is a 32 y.o. female.   Patient has symptoms of upper respiratory infection with nasal congestion, cough, symptoms of headache and mild bodyaches.  She works in Teacher, music.  She is worried about COVID.  No known exposure.  She is otherwise healthy.  She is COVID vaccinated. Most worrisome symptom is unremitting cough that keeps awake at night    Past Medical History:  Diagnosis Date   Anxiety    Depression    Gastritis    GERD (gastroesophageal reflux disease)    HSV (herpes simplex virus) infection    Ovarian cyst    Panic attack     Patient Active Problem List   Diagnosis Date Noted   Ovarian cyst 01/19/2015    Past Surgical History:  Procedure Laterality Date   NO PAST SURGERIES      OB History     Gravida  1   Para  1   Term  1   Preterm      AB      Living  1      SAB      IAB      Ectopic      Multiple      Live Births  1            Home Medications    Prior to Admission medications   Medication Sig Start Date End Date Taking? Authorizing Provider  benzonatate (TESSALON) 200 MG capsule Take 1 capsule (200 mg total) by mouth 2 (two) times daily as needed for cough. 06/04/23  Yes Eustace Moore, MD  chlorpheniramine-HYDROcodone (TUSSIONEX) 10-8 MG/5ML Take 5 mLs by mouth at bedtime as needed for cough. 06/04/23  Yes Eustace Moore, MD  predniSONE (DELTASONE) 20 MG tablet Take 2 tablets (40 mg total) by mouth daily with breakfast. 06/04/23  Yes Eustace Moore, MD  valACYclovir (VALTREX) 1000 MG tablet Indications: herpes simplex infection 09/11/22  Yes [provider]  levonorgestrel (MIRENA) 20 MCG/24HR IUD 1 each by Intrauterine route once.    [provider]    Family History Family History  Problem Relation Age  of Onset   Diabetes Maternal Grandmother    Cancer Maternal Grandmother     Social History Social History   Tobacco Use   Smoking status: Every Day    Types: Cigarettes   Smokeless tobacco: Never  Substance Use Topics   Alcohol use: Yes    Comment: occasional   Drug use: Yes    Types: Marijuana    Comment: 1 month ago      Allergies   Augmentin [amoxicillin-pot clavulanate], Stadol [butorphanol], and Toradol [ketorolac tromethamine]   Review of Systems Review of Systems  See HPI Physical Exam Triage Vital Signs ED Triage Vitals [06/04/23 1153]  Encounter Vitals Group     BP 110/75     Systolic BP Percentile      Diastolic BP Percentile      Pulse Rate 76     Resp 17     Temp 98.2 F (36.8 C)     Temp Source Oral     SpO2 100 %     Weight      Height      Head Circumference  Peak Flow      Pain Score 0     Pain Loc      Pain Education      Exclude from Growth Chart    No data found.  Updated Vital Signs BP 110/75 (BP Location: Left Arm)   Pulse 76   Temp 98.2 F (36.8 C) (Oral)   Resp 17   SpO2 100%      Physical Exam Constitutional:      General: She is not in acute distress.    Appearance: She is well-developed and normal weight. She is ill-appearing.     Comments: Appears tired  HENT:     Head: Normocephalic and atraumatic.  Eyes:     Conjunctiva/sclera: Conjunctivae normal.     Pupils: Pupils are equal, round, and reactive to light.  Cardiovascular:     Rate and Rhythm: Normal rate.  Pulmonary:     Effort: Pulmonary effort is normal. No respiratory distress.  Abdominal:     General: There is no distension.     Palpations: Abdomen is soft.  Musculoskeletal:        General: Normal range of motion.     Cervical back: Normal range of motion.  Skin:    General: Skin is warm and dry.  Neurological:     Mental Status: She is alert.      UC Treatments / Results  Labs (all labs ordered are listed, but only abnormal results are  displayed) Labs Reviewed  POC SARS CORONAVIRUS 2 AG -  ED    EKG   Radiology No results found.  Procedures Procedures (including critical care time)  Medications Ordered in UC Medications - No data to display  Initial Impression / Assessment and Plan / UC Course  I have reviewed the triage vital signs and the nursing notes.  Pertinent labs & imaging results that were available during my care of the patient were reviewed by me and considered in my medical decision making (see chart for details).     Covid neg Final Clinical Impressions(s) / UC Diagnoses   Final diagnoses:  Acute upper respiratory infection     Discharge Instructions      Continue drinking lots of water Take the Tessalon 2 times a day with the Mucinex DM or Delsym product Take prednisone daily for 5 days I have prescribed Tussionex to take at bedtime so you can sleep Call for problems     ED Prescriptions     Medication Sig Dispense Auth. Provider   predniSONE (DELTASONE) 20 MG tablet Take 2 tablets (40 mg total) by mouth daily with breakfast. 10 tablet Eustace Moore, MD   benzonatate (TESSALON) 200 MG capsule Take 1 capsule (200 mg total) by mouth 2 (two) times daily as needed for cough. 20 capsule Eustace Moore, MD   chlorpheniramine-HYDROcodone (TUSSIONEX) 10-8 MG/5ML Take 5 mLs by mouth at bedtime as needed for cough. 115 mL Eustace Moore, MD      I have reviewed the PDMP during this encounter.   Eustace Moore, MD 06/04/23 [phone]

## 2023-06-04 NOTE — ED Triage Notes (Addendum)
Pt c/o nasal congestion and cough since yesterday. Also c/o upper back pain/tenderness. Mucinex prn. 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

## 2023-10-06 ENCOUNTER — Ambulatory Visit
Admission: EM | Admit: 2023-10-06 | Discharge: 2023-10-06 | Disposition: A | Discharge status: Home / Self Care | Payer: Managed Care, Other (non HMO) | Attending: Family Medicine | Admitting: Family Medicine

## 2023-10-06 DIAGNOSIS — J069 Acute upper respiratory infection, unspecified: Secondary | ICD-10-CM

## 2023-10-06 LAB — POCT INFLUENZA A/B
Influenza A, POC: NEGATIVE
Influenza B, POC: NEGATIVE

## 2023-10-06 MED ORDER — AZITHROMYCIN 250 MG PO TABS: ORAL_TABLET | ORAL | 0 refills | Status: DC

## 2023-10-06 MED ORDER — HYDROCOD POLI-CHLORPHE POLI ER 10-8 MG/5ML PO SUER: 5.0000 mL | Freq: Every evening | ORAL | 0 refills | Status: DC | PRN

## 2023-10-06 NOTE — ED Triage Notes (Addendum)
Pt c/o cough and congestion since Sat. No fever. Sxs worsening in last 24 hours. Promethazine DM, tessalon, and OTC cold and flu meds. COVID neg at home last night. Flu exposure on Saturday.

## 2023-10-06 NOTE — Discharge Instructions (Signed)
Drink lots of water Take the Tussionex cough syrup for severe coughing.  This helps at bedtime May take Mucinex DM or Delsym during the day If you fail to improve over the next couple of days, fill and take the Z-Pak See your doctor if not improving by the end of the week

## 2023-10-27 ENCOUNTER — Other Ambulatory Visit: Payer: Self-pay

## 2023-10-27 ENCOUNTER — Ambulatory Visit: Payer: Managed Care, Other (non HMO)

## 2023-10-27 ENCOUNTER — Ambulatory Visit
Admission: EM | Admit: 2023-10-27 | Discharge: 2023-10-27 | Disposition: A | Discharge status: Home / Self Care | Payer: Managed Care, Other (non HMO) | Attending: Physician Assistant | Admitting: Physician Assistant

## 2023-10-27 DIAGNOSIS — J329 Chronic sinusitis, unspecified: Secondary | ICD-10-CM | POA: Diagnosis not present

## 2023-10-27 DIAGNOSIS — R509 Fever, unspecified: Secondary | ICD-10-CM | POA: Diagnosis not present

## 2023-10-27 DIAGNOSIS — R051 Acute cough: Secondary | ICD-10-CM

## 2023-10-27 DIAGNOSIS — J40 Bronchitis, not specified as acute or chronic: Secondary | ICD-10-CM | POA: Diagnosis not present

## 2023-10-27 DIAGNOSIS — R059 Cough, unspecified: Secondary | ICD-10-CM

## 2023-10-27 MED ORDER — ALBUTEROL SULFATE HFA 108 (90 BASE) MCG/ACT IN AERS
2.0000 | INHALATION_SPRAY | Freq: Once | RESPIRATORY_TRACT | Status: AC
  Administered 2023-10-27: 2 via RESPIRATORY_TRACT

## 2023-10-27 MED ORDER — PROMETHAZINE-DM 6.25-15 MG/5ML PO SYRP: 5.0000 mL | ORAL_SOLUTION | Freq: Four times a day (QID) | ORAL | 0 refills | Status: AC | PRN

## 2023-10-27 MED ORDER — AEROCHAMBER PLUS FLO-VU LARGE MISC
1.0000 | Freq: Once | Status: AC
  Administered 2023-10-27: 1

## 2023-10-27 MED ORDER — DOXYCYCLINE HYCLATE 100 MG PO CAPS: 100.0000 mg | ORAL_CAPSULE | Freq: Two times a day (BID) | ORAL | 0 refills | Status: DC

## 2023-10-27 MED ORDER — PREDNISONE 20 MG PO TABS: 40.0000 mg | ORAL_TABLET | Freq: Every day | ORAL | 0 refills | Status: AC

## 2023-10-27 NOTE — ED Provider Notes (Signed)
[facility]    CSN: [id number] Arrival date & time: 10/27/23  0814      History   Chief Complaint Chief Complaint  Patient presents with   Cough   Fever    HPI Jenna Velez is a 33 y.o. female.   Patient presents today with a weeklong history of URI symptoms.  She reports significant cough, drainage, sore throat, fever.  She denies any chest pain, shortness of breath, nausea/vomiting.  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  She has been taking previously prescribed Tessalon Perles and Intestinex cough medicine with only temporary relief of symptoms.  She is also tried Mucinex multisymptom medication with temporary improvement.  She reports over the past several days her symptoms have significantly worsened and she is having a worsening cough.  She was treated with azithromycin in December 2024 but has not had additional antibiotics in the past 90 days.  Denies any recent steroids.  She has used an albuterol inhaler when she had COVID several years ago but denies history of asthma or COPD.  She quit smoking over 15 years ago.  She is confident she is not pregnant.    Past Medical History:  Diagnosis Date   Anxiety    Depression    Gastritis    GERD (gastroesophageal reflux disease)    HSV (herpes simplex virus) infection    Ovarian cyst    Panic attack     Patient Active Problem List   Diagnosis Date Noted   Ovarian cyst 01/19/2015    Past Surgical History:  Procedure Laterality Date   NO PAST SURGERIES      OB History     Gravida  1   Para  1   Term  1   Preterm      AB      Living  1      SAB      IAB      Ectopic      Multiple      Live Births  1            Home Medications    Prior to Admission medications   Medication Sig Start Date End Date Taking? Authorizing Provider  doxycycline (VIBRAMYCIN) 100 MG capsule Take 1 capsule (100 mg total) by  mouth 2 (two) times daily. 10/27/23  Yes Manvi Guilliams K, PA-C  predniSONE (DELTASONE) 20 MG tablet Take 2 tablets (40 mg total) by mouth daily for 5 days. 10/27/23 11/01/23 Yes Aylani Spurlock K, PA-C  promethazine-dextromethorphan (PROMETHAZINE-DM) 6.25-15 MG/5ML syrup Take 5 mLs by mouth 4 (four) times daily as needed for cough. 10/27/23  Yes Nomi Rudnicki, Noberto Retort, PA-C  levonorgestrel (MIRENA) 20 MCG/24HR IUD 1 each by Intrauterine route once.    [provider]  valACYclovir (VALTREX) 1000 MG tablet Indications: herpes simplex infection 09/11/22   [provider]    Family History Family History  Problem Relation Age of Onset   Diabetes Maternal Grandmother    Cancer Maternal Grandmother     Social History Social History   Tobacco Use   Smoking status: Every Day    Types: Cigarettes   Smokeless tobacco: Never  Substance Use Topics   Alcohol use: Yes    Comment: occasional   Drug use: Yes    Types: Marijuana    Comment: 1 month ago      Allergies   Augmentin [amoxicillin-pot clavulanate],  Stadol [butorphanol], and Toradol [ketorolac tromethamine]   Review of Systems Review of Systems  Constitutional:  Positive for activity change, fatigue and fever. Negative for appetite change.  HENT:  Positive for congestion, postnasal drip and sore throat. Negative for sinus pressure and sneezing.   Respiratory:  Positive for cough. Negative for shortness of breath.   Cardiovascular:  Negative for chest pain.  Gastrointestinal:  Negative for abdominal pain, diarrhea, nausea and vomiting.  Neurological:  Positive for weakness (generalized) and headaches. Negative for dizziness and light-headedness.     Physical Exam Triage Vital Signs ED Triage Vitals  Encounter Vitals Group     BP 10/27/23 0823 113/76     Systolic BP Percentile --      Diastolic BP Percentile --      Pulse Rate 10/27/23 0823 100     Resp 10/27/23 0823 17     Temp 10/27/23 0823 98.4 F (36.9 C)     Temp  Source 10/27/23 0823 Oral     SpO2 10/27/23 0823 99 %     Weight --      Height --      Head Circumference --      Peak Flow --      Pain Score 10/27/23 0824 0     Pain Loc --      Pain Education --      Exclude from Growth Chart --    No data found.  Updated Vital Signs BP 113/76 (BP Location: Left Arm)   Pulse 100   Temp 98.4 F (36.9 C) (Oral)   Resp 17   SpO2 99%   Visual Acuity Right Eye Distance:   Left Eye Distance:   Bilateral Distance:    Right Eye Near:   Left Eye Near:    Bilateral Near:     Physical Exam Vitals reviewed.  Constitutional:      General: She is awake. She is not in acute distress.    Appearance: Normal appearance. She is well-developed. She is not ill-appearing.     Comments: Very pleasant female appears stated age in no acute distress sitting comfortably in exam room  HENT:     Head: Normocephalic and atraumatic.     Right Ear: Tympanic membrane, ear canal and external ear normal. Tympanic membrane is not erythematous or bulging.     Left Ear: Tympanic membrane, ear canal and external ear normal. Tympanic membrane is not erythematous or bulging.     Nose: Nose normal.     Mouth/Throat:     Pharynx: Uvula midline. Postnasal drip present. No oropharyngeal exudate or posterior oropharyngeal erythema.  Cardiovascular:     Rate and Rhythm: Normal rate and regular rhythm.     Heart sounds: Normal heart sounds, S1 normal and S2 normal. No murmur heard. Pulmonary:     Effort: Pulmonary effort is normal.     Breath sounds: Examination of the right-lower field reveals decreased breath sounds. Examination of the left-lower field reveals decreased breath sounds. Decreased breath sounds present. No wheezing, rhonchi or rales.     Comments: Reactive cough with deep breathing Psychiatric:        Behavior: Behavior is cooperative.      UC Treatments / Results  Labs (all labs ordered are listed, but only abnormal results are displayed) Labs  Reviewed - No data to display  EKG   Radiology DG Chest 2 View Result Date: 10/27/2023 CLINICAL DATA:  Cough and fever EXAM: CHEST - 2 VIEW COMPARISON:  None Available. FINDINGS: The heart size and mediastinal contours are within normal limits. Both lungs are clear. The visualized skeletal structures are unremarkable. IMPRESSION: No active cardiopulmonary disease. Electronically Signed   By: Judie Petit.  Shick M.D.   On: 10/27/2023 08:59    Procedures Procedures (including critical care time)  Medications Ordered in UC Medications  albuterol (VENTOLIN HFA) 108 (90 Base) MCG/ACT inhaler 2 puff (2 puffs Inhalation Given 10/27/23 0901)  AeroChamber Plus Flo-Vu Large MISC 1 each (1 each Other Given 10/27/23 0901)    Initial Impression / Assessment and Plan / UC Course  I have reviewed the triage vital signs and the nursing notes.  Pertinent labs & imaging results that were available during my care of the patient were reviewed by me and considered in my medical decision making (see chart for details).     Patient is well-appearing, afebrile, nontoxic, nontachycardic.  Viral testing was deferred as she has already been symptomatic for a week thus would not change management.  Chest x-ray was obtained that showed no acute cardiopulmonary disease.  She was given albuterol in clinic with improvement of symptoms.  She was sent home with this medication to be used every 4-6 hours as needed.  Given prolonged and worsening symptoms concern for secondary bacterial infection so we will treat for sinobronchitis.  She was started on doxycycline 100 mg twice daily for 10 days given allergy to Augmentin.  Discussed that she is to avoid prolonged sun exposure while on this medication as it can cause photosensitivity.  She was started on prednisone 40 mg for 5 days with instruction not to take NSAIDs with this medicine.  She was given Promethazine DM for cough.  We discussed that this can be sedating and she is not to  drive or drink alcohol with taking it.  She can use over-the-counter medications for additional symptom relief.  Recommended rest and drinking plenty of fluid.  If her symptoms are not improving within 3 to 5 days she is to return for reevaluation.  Discussed that if she has any worsening symptoms including high fever not responding to antipyretics, chest pain, shortness of breath, worsening cough, weakness she needs to be seen immediately.  Strict return precautions given.  Work excuse note provided.  Final Clinical Impressions(s) / UC Diagnoses   Final diagnoses:  Acute cough  Sinobronchitis     Discharge Instructions      Your x-ray did not show any evidence of pneumonia.  I suspect that you have a sinus/bronchitis infection.  Start doxycycline 100 mg twice daily for 10 days.  Stay out of the sun while on this medication.  Start prednisone 40 mg for 5 days.  Do not take NSAIDs with this medication including aspirin, ibuprofen/Advil, naproxen/Aleve.  Use Promethazine DM for cough.  This will make you sleepy so do not drive or drink alcohol taking it.  You can use over-the-counter medications including Mucinex, Flonase, Tylenol, nasal saline/sinus rinses.  If your symptoms are not improving within 3 to 5 days please return.  If anything worsens and you have high fever, worsening cough, shortness of breath, chest pain you should be seen immediately.     ED Prescriptions     Medication Sig Dispense Auth. Provider   doxycycline (VIBRAMYCIN) 100 MG capsule Take 1 capsule (100 mg total) by mouth 2 (two) times daily. 20 capsule Marston Mccadden K, PA-C   predniSONE (DELTASONE) 20 MG tablet Take 2 tablets (40 mg total) by mouth daily for 5 days.  10 tablet Larren Copes K, PA-C   promethazine-dextromethorphan (PROMETHAZINE-DM) 6.25-15 MG/5ML syrup Take 5 mLs by mouth 4 (four) times daily as needed for cough. 118 mL Zalmen Wrightsman K, PA-C      I have reviewed the PDMP during this encounter.   Jeani Hawking, PA-C 10/27/23 1610

## 2023-10-27 NOTE — ED Triage Notes (Addendum)
Pt cough and fever since last night. Tmax 102 last night. Taking tylenol, tessalon and promethazine prn.

## 2023-10-27 NOTE — Discharge Instructions (Signed)
Your x-ray did not show any evidence of pneumonia.  I suspect that you have a sinus/bronchitis infection.  Start doxycycline 100 mg twice daily for 10 days.  Stay out of the sun while on this medication.  Start prednisone 40 mg for 5 days.  Do not take NSAIDs with this medication including aspirin, ibuprofen/Advil, naproxen/Aleve.  Use Promethazine DM for cough.  This will make you sleepy so do not drive or drink alcohol taking it.  You can use over-the-counter medications including Mucinex, Flonase, Tylenol, nasal saline/sinus rinses.  If your symptoms are not improving within 3 to 5 days please return.  If anything worsens and you have high fever, worsening cough, shortness of breath, chest pain you should be seen immediately.

## 2023-12-16 ENCOUNTER — Ambulatory Visit: Admission: EM | Admit: 2023-12-16 | Discharge: 2023-12-16 | Disposition: A | Discharge status: Home / Self Care

## 2023-12-16 DIAGNOSIS — K2900 Acute gastritis without bleeding: Secondary | ICD-10-CM | POA: Diagnosis not present

## 2023-12-16 LAB — POCT INFLUENZA A/B
Influenza A, POC: NEGATIVE
Influenza B, POC: NEGATIVE

## 2023-12-16 MED ORDER — ONDANSETRON 4 MG PO TBDP
4.0000 mg | ORAL_TABLET | Freq: Once | ORAL | Status: AC
  Administered 2023-12-16: 4 mg via ORAL

## 2023-12-16 MED ORDER — ALUM & MAG HYDROXIDE-SIMETH 200-200-20 MG/5ML PO SUSP
30.0000 mL | Freq: Once | ORAL | Status: AC
  Administered 2023-12-16: 30 mL via ORAL

## 2023-12-16 MED ORDER — LIDOCAINE VISCOUS HCL 2 % MT SOLN
15.0000 mL | Freq: Once | OROMUCOSAL | Status: AC
  Administered 2023-12-16: 15 mL via OROMUCOSAL

## 2023-12-16 MED ORDER — ONDANSETRON 4 MG PO TBDP: 4.0000 mg | ORAL_TABLET | Freq: Four times a day (QID) | ORAL | 0 refills | Status: AC | PRN

## 2023-12-16 NOTE — Discharge Instructions (Signed)
 Negative flu test today  I do recommend to continue zofran every 6 hours if needed to settle the stomach  You can try Maalox over the counter if this helps with acid reflux, burping, and abdominal discomfort   I also recommend pepcid 1-2 times daily

## 2023-12-16 NOTE — ED Provider Notes (Signed)
 [name]    CSN: [id number] Arrival date & time: 12/16/23  1640      History   Chief Complaint Chief Complaint  Patient presents with   Nausea   Emesis   Abdominal Pain    upper    HPI Jenna Velez is a 33 y.o. female.  About 3-hour history of nausea with epigastric pain Emesis twice.  Nonbloody nonbilious Also having increased burping with foul taste Low grade temp at work, some chills and fatigue  Has used Tylenol and Zofran  History of GERD and gastritis several years ago. Hasn't had many issues with reflux since pregnancy.  Other possible sick contacts  Past Medical History:  Diagnosis Date   Anxiety    Depression    Gastritis    GERD (gastroesophageal reflux disease)    HSV (herpes simplex virus) infection    Ovarian cyst    Panic attack     Patient Active Problem List   Diagnosis Date Noted   Ovarian cyst 01/19/2015    Past Surgical History:  Procedure Laterality Date   NO PAST SURGERIES      OB History     Gravida  1   Para  1   Term  1   Preterm      AB      Living  1      SAB      IAB      Ectopic      Multiple      Live Births  1            Home Medications    Prior to Admission medications   Medication Sig Start Date End Date Taking? Authorizing Provider  busPIRone (BUSPAR) 5 MG tablet Take by mouth. 08/28/23  Yes [provider]  ondansetron (ZOFRAN-ODT) 4 MG disintegrating tablet Take 1 tablet (4 mg total) by mouth every 6 (six) hours as needed for nausea or vomiting. 12/16/23  Yes Ramey Schiff, Lurena Joiner, PA-C  levonorgestrel (MIRENA) 20 MCG/24HR IUD 1 each by Intrauterine route once.    [provider]  promethazine-dextromethorphan (PROMETHAZINE-DM) 6.25-15 MG/5ML syrup Take 5 mLs by mouth 4 (four) times daily as needed for cough. 10/27/23   Raspet, Noberto Retort, PA-C  valACYclovir (VALTREX) 1000 MG tablet Indications: herpes simplex infection 09/11/22   [provider]    Family  History Family History  Problem Relation Age of Onset   Diabetes Maternal Grandmother    Cancer Maternal Grandmother     Social History Social History   Tobacco Use   Smoking status: Every Day    Types: Cigarettes   Smokeless tobacco: Never  Substance Use Topics   Alcohol use: Yes    Comment: occasional   Drug use: Yes    Types: Marijuana    Comment: 1 month ago      Allergies   Augmentin [amoxicillin-pot clavulanate], Stadol [butorphanol], and Toradol [ketorolac tromethamine]   Review of Systems Review of Systems Per HPI  Physical Exam Triage Vital Signs ED Triage Vitals  Encounter Vitals Group     BP 12/16/23 1725 112/68     Systolic BP Percentile --      Diastolic BP Percentile --      Pulse Rate 12/16/23 1725 92     Resp 12/16/23 1725 19     Temp 12/16/23 1725 97.8 F (36.6 C)     Temp Source 12/16/23 1725 Oral     SpO2 12/16/23 1725 98 %  Weight --      Height --      Head Circumference --      Peak Flow --      Pain Score 12/16/23 1723 6     Pain Loc --      Pain Education --      Exclude from Growth Chart --    No data found.  Updated Vital Signs BP 112/68 (BP Location: Right Arm)   Pulse 92   Temp 97.8 F (36.6 C) (Oral)   Resp 19   SpO2 98%   Physical Exam Vitals and nursing note reviewed.  Constitutional:      Appearance: Normal appearance.  HENT:     Mouth/Throat:     Mouth: Mucous membranes are moist.     Pharynx: Oropharynx is clear.  Eyes:     Conjunctiva/sclera: Conjunctivae normal.  Cardiovascular:     Rate and Rhythm: Normal rate and regular rhythm.     Heart sounds: Normal heart sounds.  Pulmonary:     Effort: Pulmonary effort is normal. No respiratory distress.     Breath sounds: Normal breath sounds.  Abdominal:     General: Bowel sounds are normal.     Palpations: Abdomen is soft.     Tenderness: There is abdominal tenderness in the epigastric area. There is no right CVA tenderness, left CVA tenderness, guarding  or rebound. Negative signs include Murphy's sign.  Musculoskeletal:        General: Normal range of motion.     Cervical back: Normal range of motion.  Skin:    General: Skin is warm and dry.  Neurological:     Mental Status: She is alert and oriented to person, place, and time.     UC Treatments / Results  Labs (all labs ordered are listed, but only abnormal results are displayed) Labs Reviewed  POCT INFLUENZA A/B - Normal    EKG   Radiology No results found.  Procedures Procedures (including critical care time)  Medications Ordered in UC Medications  ondansetron (ZOFRAN-ODT) disintegrating tablet 4 mg (4 mg Oral Given 12/16/23 1728)  alum & mag hydroxide-simeth (MAALOX/MYLANTA) 200-200-20 MG/5ML suspension 30 mL (30 mLs Oral Given 12/16/23 1804)  lidocaine (XYLOCAINE) 2 % viscous mouth solution 15 mL (15 mLs Mouth/Throat Given 12/16/23 1804)    Initial Impression / Assessment and Plan / UC Course  I have reviewed the triage vital signs and the nursing notes.  Pertinent labs & imaging results that were available during my care of the patient were reviewed by me and considered in my medical decision making (see chart for details).  Zofran ODT given, nausea resolved GI cocktail improved stomach pain Overall feeling better than on arrival Rapid flu A/B negative - per patient request  Advised continue zofran, OTC Maalox, and start pepcid 1-2 times daily. Monitor symptoms. Strict return and ED precautions Patient agrees to plan, no questions Note for work is provided  Final Clinical Impressions(s) / UC Diagnoses   Final diagnoses:  Acute gastritis without hemorrhage, unspecified gastritis type     Discharge Instructions      Negative flu test today  I do recommend to continue zofran every 6 hours if needed to settle the stomach  You can try Maalox over the counter if this helps with acid reflux, burping, and abdominal discomfort   I also recommend pepcid 1-2  times daily     ED Prescriptions     Medication Sig Dispense Auth. Provider   ondansetron (  ZOFRAN-ODT) 4 MG disintegrating tablet Take 1 tablet (4 mg total) by mouth every 6 (six) hours as needed for nausea or vomiting. 30 tablet Jenna Velez, Lurena Joiner, PA-C      PDMP not reviewed this encounter.   Jenna Velez, Ray Church 12/16/23 2053

## 2023-12-16 NOTE — ED Triage Notes (Addendum)
 Pt c/o nausea, emesis and upper abd pain since 230p today. 2 episodes of vomiting. Says when she burps, its smells and tastes foul. Tylenol and zofran prn.
